# Patient Record
Sex: Female | Born: 1995
Health system: Southern US, Community
[De-identification: ages and names within clinical notes are randomized; demographics above are authoritative.]

## PROBLEM LIST (undated history)

## (undated) DIAGNOSIS — D649 Anemia, unspecified: Secondary | ICD-10-CM

## (undated) HISTORY — PX: NO PAST SURGERIES: SHX2092

---
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
## 2018-03-24 ENCOUNTER — Ambulatory Visit: Payer: Self-pay | Admitting: Certified Nurse Midwife

## 2018-04-20 NOTE — L&D Delivery Note (Addendum)
OB/[facility]N Faculty Practice Delivery Note  Tami Clark is a 23 y.o. G1P1 s/p SVD at [redacted]w[redacted]d. She was admitted for SOL after SROM.   ROM: 25h 28m with clear fluid GBS Status: PCN given Positive/-- (10/15 0000) Maximum Maternal Temperature:  Temp (24hrs), Avg:97.9 F (36.6 C), Min:97.6 F (36.4 C), Max:98.2 F (36.8 C)    Labor Progress: . Patient arrived at 3 cm dilation w/ROM and was induced with pitocin.   Delivery Date/Time: 02/16/2019 at 2131 Delivery: Called to room and patient was complete and pushing. Head delivered in ROA position. No nuchal cord present. Shoulder and body delivered in usual fashion. Infant with spontaneous cry, placed on mother's abdomen, dried and stimulated. Cord clamped x 2 after 1-minute delay, and cut by grandmother. Cord blood drawn. Placenta delivered spontaneously with gentle cord traction. Fundus firm with massage and Pitocin. Labia, perineum, vagina, and cervix inspected with a right periurethral laceration which was repaired with a 4-0 monocryl.  She was also found to have a first-degree perineal body tear that was repaired with 3-0 Vicryl rapid.  Placenta: Spontaneous, intact, extra lobe, three-vessel cord Complications: None Lacerations: Right periurethral and first-degree perineal body EBL: 583 Analgesia: Epidural in place  Infant: APGAR (1 MIN): 9   APGAR (5 MINS): 9   APGAR (10 MINS):    Weight: Pending  IUD placed postplacentally (see separate note).   Gifford Shave, MD  PGY-1, [facility]  02/16/2019 10:05 PM   The above was performed under my direct supervision and guidance.

## 2018-09-14 LAB — OB RESULTS CONSOLE VARICELLA ZOSTER ANTIBODY, IGG: Varicella: IMMUNE

## 2018-09-14 LAB — OB RESULTS CONSOLE RUBELLA ANTIBODY, IGM: Rubella: IMMUNE

## 2018-09-14 LAB — OB RESULTS CONSOLE HIV ANTIBODY (ROUTINE TESTING): HIV: NONREACTIVE

## 2018-09-14 LAB — OB RESULTS CONSOLE RPR: RPR: NONREACTIVE

## 2019-02-02 DIAGNOSIS — Z23 Encounter for immunization: Secondary | ICD-10-CM | POA: Diagnosis not present

## 2019-02-02 DIAGNOSIS — O321XX0 Maternal care for breech presentation, not applicable or unspecified: Secondary | ICD-10-CM | POA: Diagnosis not present

## 2019-02-02 DIAGNOSIS — Z3403 Encounter for supervision of normal first pregnancy, third trimester: Secondary | ICD-10-CM | POA: Diagnosis not present

## 2019-02-02 DIAGNOSIS — Z3689 Encounter for other specified antenatal screening: Secondary | ICD-10-CM | POA: Diagnosis not present

## 2019-02-02 DIAGNOSIS — O99013 Anemia complicating pregnancy, third trimester: Secondary | ICD-10-CM | POA: Diagnosis not present

## 2019-02-02 LAB — OB RESULTS CONSOLE GC/CHLAMYDIA
Chlamydia: NEGATIVE
Gonorrhea: NEGATIVE

## 2019-02-02 LAB — OB RESULTS CONSOLE GBS: GBS: POSITIVE

## 2019-02-10 DIAGNOSIS — Z3A38 38 weeks gestation of pregnancy: Secondary | ICD-10-CM | POA: Diagnosis not present

## 2019-02-10 DIAGNOSIS — O99820 Streptococcus B carrier state complicating pregnancy: Secondary | ICD-10-CM | POA: Diagnosis not present

## 2019-02-10 DIAGNOSIS — O99013 Anemia complicating pregnancy, third trimester: Secondary | ICD-10-CM | POA: Diagnosis not present

## 2019-02-16 ENCOUNTER — Inpatient Hospital Stay (HOSPITAL_COMMUNITY): Payer: BC Managed Care – PPO | Admitting: Anesthesiology

## 2019-02-16 ENCOUNTER — Encounter (HOSPITAL_COMMUNITY): Payer: Self-pay | Admitting: *Deleted

## 2019-02-16 ENCOUNTER — Other Ambulatory Visit: Payer: Self-pay

## 2019-02-16 ENCOUNTER — Inpatient Hospital Stay (HOSPITAL_COMMUNITY)
Admission: AD | Admit: 2019-02-16 | Discharge: 2019-02-18 | DRG: 807 | Disposition: A | Discharge status: Home / Self Care | Payer: BC Managed Care – PPO | Attending: Obstetrics & Gynecology | Admitting: Obstetrics & Gynecology

## 2019-02-16 DIAGNOSIS — Z3A38 38 weeks gestation of pregnancy: Secondary | ICD-10-CM

## 2019-02-16 DIAGNOSIS — D649 Anemia, unspecified: Secondary | ICD-10-CM | POA: Diagnosis present

## 2019-02-16 DIAGNOSIS — Z30430 Encounter for insertion of intrauterine contraceptive device: Secondary | ICD-10-CM

## 2019-02-16 DIAGNOSIS — O0932 Supervision of pregnancy with insufficient antenatal care, second trimester: Secondary | ICD-10-CM

## 2019-02-16 DIAGNOSIS — O99824 Streptococcus B carrier state complicating childbirth: Secondary | ICD-10-CM | POA: Diagnosis present

## 2019-02-16 DIAGNOSIS — O99019 Anemia complicating pregnancy, unspecified trimester: Secondary | ICD-10-CM | POA: Diagnosis present

## 2019-02-16 DIAGNOSIS — O4290 Premature rupture of membranes, unspecified as to length of time between rupture and onset of labor, unspecified weeks of gestation: Secondary | ICD-10-CM | POA: Diagnosis present

## 2019-02-16 DIAGNOSIS — Z20828 Contact with and (suspected) exposure to other viral communicable diseases: Secondary | ICD-10-CM | POA: Diagnosis not present

## 2019-02-16 DIAGNOSIS — O26893 Other specified pregnancy related conditions, third trimester: Secondary | ICD-10-CM | POA: Diagnosis not present

## 2019-02-16 DIAGNOSIS — Z975 Presence of (intrauterine) contraceptive device: Secondary | ICD-10-CM

## 2019-02-16 DIAGNOSIS — B951 Streptococcus, group B, as the cause of diseases classified elsewhere: Secondary | ICD-10-CM

## 2019-02-16 DIAGNOSIS — O9902 Anemia complicating childbirth: Principal | ICD-10-CM | POA: Diagnosis present

## 2019-02-16 DIAGNOSIS — Z3480 Encounter for supervision of other normal pregnancy, unspecified trimester: Secondary | ICD-10-CM

## 2019-02-16 DIAGNOSIS — Z3A00 Weeks of gestation of pregnancy not specified: Secondary | ICD-10-CM | POA: Diagnosis not present

## 2019-02-16 HISTORY — DX: Streptococcus, group b, as the cause of diseases classified elsewhere: B95.1

## 2019-02-16 HISTORY — DX: Anemia, unspecified: D64.9

## 2019-02-16 LAB — CBC
HCT: 33.9 % — ABNORMAL LOW (ref 36.0–46.0)
Hemoglobin: 11 g/dL — ABNORMAL LOW (ref 12.0–15.0)
MCH: 30.2 pg (ref 26.0–34.0)
MCHC: 32.4 g/dL (ref 30.0–36.0)
MCV: 93.1 fL (ref 80.0–100.0)
Platelets: 221 10*3/uL (ref 150–400)
RBC: 3.64 MIL/uL — ABNORMAL LOW (ref 3.87–5.11)
RDW: 12.7 % (ref 11.5–15.5)
WBC: 13.4 10*3/uL — ABNORMAL HIGH (ref 4.0–10.5)
nRBC: 0 % (ref 0.0–0.2)

## 2019-02-16 LAB — POCT PREGNANCY, URINE: Preg Test, Ur: POSITIVE — AB

## 2019-02-16 LAB — TYPE AND SCREEN
ABO/RH(D): O POS
Antibody Screen: NEGATIVE

## 2019-02-16 LAB — SARS CORONAVIRUS 2 BY RT PCR (HOSPITAL ORDER, PERFORMED IN ~~LOC~~ HOSPITAL LAB): SARS Coronavirus 2: NEGATIVE

## 2019-02-16 LAB — POCT FERN TEST: POCT Fern Test: POSITIVE

## 2019-02-16 LAB — RPR: RPR Ser Ql: NONREACTIVE

## 2019-02-16 LAB — ABO/RH: ABO/RH(D): O POS

## 2019-02-16 MED ORDER — LACTATED RINGERS IV SOLN: 500.0000 mL | INTRAVENOUS | Status: DC | PRN

## 2019-02-16 MED ORDER — OXYTOCIN 40 UNITS IN NORMAL SALINE INFUSION - SIMPLE MED
1.0000 m[IU]/min | INTRAVENOUS | Status: DC
  Administered 2019-02-16: 2 m[IU]/min via INTRAVENOUS
  Filled 2019-02-16: qty 1000

## 2019-02-16 MED ORDER — OXYCODONE-ACETAMINOPHEN 5-325 MG PO TABS: 2.0000 | ORAL_TABLET | ORAL | Status: DC | PRN

## 2019-02-16 MED ORDER — LACTATED RINGERS IV SOLN
INTRAVENOUS | Status: DC
  Administered 2019-02-16 (×2): via INTRAVENOUS

## 2019-02-16 MED ORDER — PHENYLEPHRINE 40 MCG/ML (10ML) SYRINGE FOR IV PUSH (FOR BLOOD PRESSURE SUPPORT): 80.0000 ug | PREFILLED_SYRINGE | INTRAVENOUS | Status: DC | PRN

## 2019-02-16 MED ORDER — OXYCODONE-ACETAMINOPHEN 5-325 MG PO TABS: 1.0000 | ORAL_TABLET | ORAL | Status: DC | PRN

## 2019-02-16 MED ORDER — PHENYLEPHRINE 40 MCG/ML (10ML) SYRINGE FOR IV PUSH (FOR BLOOD PRESSURE SUPPORT)
80.0000 ug | PREFILLED_SYRINGE | INTRAVENOUS | Status: DC | PRN
  Filled 2019-02-16: qty 10

## 2019-02-16 MED ORDER — SOD CITRATE-CITRIC ACID 500-334 MG/5ML PO SOLN: 30.0000 mL | ORAL | Status: DC | PRN

## 2019-02-16 MED ORDER — EPHEDRINE 5 MG/ML INJ: 10.0000 mg | INTRAVENOUS | Status: DC | PRN

## 2019-02-16 MED ORDER — LIDOCAINE HCL (PF) 1 % IJ SOLN: 30.0000 mL | INTRAMUSCULAR | Status: DC | PRN

## 2019-02-16 MED ORDER — SODIUM CHLORIDE (PF) 0.9 % IJ SOLN
INTRAMUSCULAR | Status: DC | PRN
  Administered 2019-02-16: 12 mL/h via EPIDURAL

## 2019-02-16 MED ORDER — SODIUM CHLORIDE 0.9 % IV SOLN
5.0000 10*6.[IU] | Freq: Once | INTRAVENOUS | Status: AC
  Administered 2019-02-16: 5 10*6.[IU] via INTRAVENOUS
  Filled 2019-02-16: qty 5

## 2019-02-16 MED ORDER — FENTANYL CITRATE (PF) 100 MCG/2ML IJ SOLN: 100.0000 ug | INTRAMUSCULAR | Status: DC | PRN

## 2019-02-16 MED ORDER — TERBUTALINE SULFATE 1 MG/ML IJ SOLN: 0.2500 mg | Freq: Once | INTRAMUSCULAR | Status: DC | PRN

## 2019-02-16 MED ORDER — FENTANYL-BUPIVACAINE-NACL 0.5-0.125-0.9 MG/250ML-% EP SOLN: 12.0000 mL/h | EPIDURAL | Status: DC | PRN

## 2019-02-16 MED ORDER — LACTATED RINGERS IV SOLN
500.0000 mL | Freq: Once | INTRAVENOUS | Status: AC
  Administered 2019-02-16: 500 mL via INTRAVENOUS

## 2019-02-16 MED ORDER — ACETAMINOPHEN 325 MG PO TABS: 650.0000 mg | ORAL_TABLET | ORAL | Status: DC | PRN

## 2019-02-16 MED ORDER — ONDANSETRON HCL 4 MG/2ML IJ SOLN
4.0000 mg | Freq: Four times a day (QID) | INTRAMUSCULAR | Status: DC | PRN
  Administered 2019-02-16: 4 mg via INTRAVENOUS
  Filled 2019-02-16: qty 2

## 2019-02-16 MED ORDER — LEVONORGESTREL 19.5 MCG/DAY IU IUD
INTRAUTERINE_SYSTEM | Freq: Once | INTRAUTERINE | Status: AC
  Administered 2019-02-16: 1 via INTRAUTERINE
  Filled 2019-02-16: qty 1

## 2019-02-16 MED ORDER — PENICILLIN G 3 MILLION UNITS IVPB - SIMPLE MED
3.0000 10*6.[IU] | INTRAVENOUS | Status: DC
  Administered 2019-02-16 (×3): 3 10*6.[IU] via INTRAVENOUS
  Filled 2019-02-16 (×5): qty 100

## 2019-02-16 MED ORDER — OXYTOCIN 40 UNITS IN NORMAL SALINE INFUSION - SIMPLE MED
2.5000 [IU]/h | INTRAVENOUS | Status: DC
  Administered 2019-02-16: 2.5 [IU]/h via INTRAVENOUS

## 2019-02-16 MED ORDER — DIPHENHYDRAMINE HCL 50 MG/ML IJ SOLN: 12.5000 mg | INTRAMUSCULAR | Status: DC | PRN

## 2019-02-16 MED ORDER — FENTANYL-BUPIVACAINE-NACL 0.5-0.125-0.9 MG/250ML-% EP SOLN
12.0000 mL/h | EPIDURAL | Status: DC | PRN
  Filled 2019-02-16: qty 250

## 2019-02-16 MED ORDER — OXYTOCIN BOLUS FROM INFUSION
500.0000 mL | Freq: Once | INTRAVENOUS | Status: AC
  Administered 2019-02-16: 500 mL via INTRAVENOUS

## 2019-02-16 MED ORDER — LIDOCAINE HCL (PF) 1 % IJ SOLN
INTRAMUSCULAR | Status: DC | PRN
  Administered 2019-02-16 (×2): 5 mL via EPIDURAL

## 2019-02-16 NOTE — Progress Notes (Signed)
LABOR PROGRESS NOTE  Tami Clark is a 23 y.o. female, G1P0 at 38.6 weeks, presenting for SROM that occurred at 2030. Pregnancy complicated by GBS + status and anemia of pregnancy   Subjective: Feeling increased contractions and endorses LOF and no vaginal bleeding.   Objective: BP 124/86 (BP Location: Right Arm)   Pulse 68   Temp 98 F (36.7 C) (Axillary)   Resp 16   Ht 5\' 2"  (1.575 m)   Wt 78 kg   SpO2 100%   BMI 31.46 kg/m  or  Vitals:   02/16/19 1801 02/16/19 1831 02/16/19 1901 02/16/19 1905  BP: 108/61 105/69 124/86   Pulse: 60 72 68   Resp:      Temp:    98 F (36.7 C)  TempSrc:    Axillary  SpO2:      Weight:      Height:        Dilation: 8 Effacement (%): 80, 90 Cervical Position: Middle Station: -1 Presentation: Vertex Exam by:: Loraine Grip, RN; Jones Bales, MD FHT: baseline rate 130, moderate varibility, +acel, -decel Toco: q 3 min  Labs: Lab Results  Component Value Date   WBC 13.4 (H) 02/16/2019   HGB 11.0 (L) 02/16/2019   HCT 33.9 (L) 02/16/2019   MCV 93.1 02/16/2019   PLT 221 02/16/2019    Patient Active Problem List   Diagnosis Date Noted  . Amniotic fluid leaking 02/16/2019  . Group beta Strep positive 02/16/2019  . Anemia in pregnancy, unspecified trimester 02/16/2019  . Late prenatal care in second trimester 02/16/2019    Assessment / Plan:  Tami Clark is a 23 y.o. female, G1P0 at 38.6 weeks, presenting for SROM that occurred at 2030. Pregnancy complicated by GBS + status and anemia of pregnancy   Labor: Progressing in active labor at 8 cm @ 1720. Continuing Augmentation with pitocin  ID : Penicillin ppx  Fetal Wellbeing:  Cat 1  Pain Control:  Epidural  Anticipated MOD:  VD  Maxie Better, PGY-1 Labor and Delivery Teaching service  02/16/2019, 7:18 PM

## 2019-02-16 NOTE — Anesthesia Preprocedure Evaluation (Signed)

## 2019-02-16 NOTE — Anesthesia Procedure Notes (Signed)
Epidural Patient location during procedure: OB  Staffing Anesthesiologist: Aurelio Mccamy, MD Performed: anesthesiologist   Preanesthetic Checklist Completed: patient identified, site marked, surgical consent, pre-op evaluation, timeout performed, IV checked, risks and benefits discussed and monitors and equipment checked  Epidural Patient position: sitting Prep: DuraPrep Patient monitoring: heart rate, continuous pulse ox and blood pressure Approach: right paramedian Location: L3-L4 Injection technique: LOR saline  Needle:  Needle type: Tuohy  Needle gauge: 17 G Needle length: 9 cm and 9 Needle insertion depth: 6 cm Catheter type: closed end flexible Catheter size: 20 Guage Catheter at skin depth: 10 cm Test dose: negative  Assessment Events: blood not aspirated, injection not painful, no injection resistance, negative IV test and no paresthesia  Additional Notes Patient identified. Risks/Benefits/Options discussed with patient including but not limited to bleeding, infection, nerve damage, paralysis, failed block, incomplete pain control, headache, blood pressure changes, nausea, vomiting, reactions to medication both or allergic, itching and postpartum back pain. Confirmed with bedside nurse the patient's most recent platelet count. Confirmed with patient that they are not currently taking any anticoagulation, have any bleeding history or any family history of bleeding disorders. Patient expressed understanding and wished to proceed. All questions were answered. Sterile technique was used throughout the entire procedure. Please see nursing notes for vital signs. Test dose was given through epidural needle and negative prior to continuing to dose epidural or start infusion. Warning signs of high block given to the patient including shortness of breath, tingling/numbness in hands, complete motor block, or any concerning symptoms with instructions to call for help. Patient was given  instructions on fall risk and not to get out of bed. All questions and concerns addressed with instructions to call with any issues.     

## 2019-02-16 NOTE — Procedures (Signed)
Post-Placental IUD Insertion Procedure Note  Patient identified, informed consent signed prior to delivery, signed copy in chart, time out was performed.    Vaginal, labial and perineal areas thoroughly inspected for lacerations. 1st degree laceration identified - not hemostatic,repaired prior to insertion of IUD.  Tami Clark  - - IUD inserted with inserter per manufacturer's instructions.    Strings trimmed to the level of the introitus. Patient tolerated procedure well.   Patient given post procedure instructions and IUD care card with expiration date.  Patient is asked to keep IUD strings tucked in her vagina until her postpartum follow up visit in 4-6 weeks. Patient advised to abstain from sexual intercourse and pulling on strings before her follow-up visit. Patient verbalized an understanding of the plan of care and agrees.

## 2019-02-16 NOTE — MAU Note (Signed)
Leaked at 2030 last night and some mucous d/c throughout the night. Now having some pink to bright spotting. Mild cramping. PNC at [facility] in [place]

## 2019-02-16 NOTE — MAU Note (Signed)
GBS positive per pt done on 02/02/19. GC.CT neg.    Gilmer Mor RN

## 2019-02-16 NOTE — H&P (Signed)
Tami Clark is a 23 y.o. female, G1P0 at 38.6 weeks, presenting for SROM that occurred at 2030.  Patient receives care at [facility] in [place], [place] and was supervised for a low  risk pregnancy. Pregnancy and medical history significant for problems as listed below. She is GBS positive and expresses a desire for epidural for pain management.  She is anticipating a female infant and requests IUD for PP birth control method.     Patient Active Problem List   Diagnosis Date Noted  . Amniotic fluid leaking 02/16/2019  . Group beta Strep positive 02/16/2019  . Anemia in pregnancy, unspecified trimester 02/16/2019  . Late prenatal care in second trimester 02/16/2019    History of present pregnancy: Patient entered care at 16.5 weeks.   EDC of 02/24/2019 was established by 13 week [place] on Aug 24, 2018.   Anatomy scan:  21 weeks, with normal findings and an posterior gr 0 placenta.   Additional [place] evaluations:  F/U Anatomy at 24 weeks-Normal Significant prenatal events: 1st Trimester: No Care 2nd Trimester: Entry to Care with [place], Patient with common pregnancy discomforts to include fatigue, leg cramping, and aching feet. 3rd Trimester:   Anemia with HgB at 10-Started Iron Last evaluation:  02/10/2019 in office by her Primary OB Provider  OB History    Gravida  1   Para      Term      Preterm      AB      Living        SAB      TAB      Ectopic      Multiple      Live Births             Past Medical History:  Diagnosis Date  . Anemia    Past Surgical History:  Procedure Laterality Date  . NO PAST SURGERIES     Family History: family history includes Arthritis in her father, maternal uncle, paternal aunt, and paternal grandmother; Asthma in her maternal grandfather and paternal aunt; Diabetes in her maternal grandfather; Hypertension in her maternal aunt, maternal grandmother, paternal aunt, and paternal grandmother; Stroke in her maternal  grandfather. Social History:  reports that she has never smoked. She has never used smokeless tobacco. She reports that she does not drink alcohol or use drugs.   Prenatal Transfer Tool  Maternal Diabetes: No Genetic Screening: Normal Maternal Ultrasounds/Referrals: Normal Fetal Ultrasounds or other Referrals:  None Maternal Substance Abuse:  No Significant Maternal Medications:  Meds include: Other: Iron Significant Maternal Lab Results: Group B Strep positive   ROS:  +FM, +LOF, +Ctx,  No other complaints  No Known Allergies   Dilation: 3 Effacement (%): 50 Station: -2 Exam by:: Loraine Grip, RN  Blood pressure (!) 130/92, pulse 73, temperature 97.7 F (36.5 C), temperature source Oral, resp. rate 16, height 5\' 2"  (1.575 m), weight 78 kg.  Physical Exam  Constitutional: She is oriented to person, place, and time. She appears well-developed and well-nourished.  HENT:  Head: Normocephalic and atraumatic.  Eyes: Conjunctivae are normal.  Neck: Normal range of motion.  Cardiovascular: Normal rate and normal heart sounds.  Respiratory: Effort normal and breath sounds normal.  GI: Soft. Bowel sounds are normal.  Musculoskeletal: Normal range of motion.  Neurological: She is alert and oriented to person, place, and time.  Skin: Skin is warm and dry.  Multiple Tattoos  Psychiatric: She has a normal  mood and affect. Her behavior is normal.    Leopolds: EFW: 7lbs Presentation: Vertex  FHR: 125 bpm, Mod Var, -Decels, +Accels UCs:  Q3-64min, palpates mild  Prenatal labs: ABO, Rh: --/--/O POS, O POS Performed at [facility], [address], [place], [place] [address]  [phone]/29 0720) Antibody: NEG (10/29 0720) Rubella:  Immune RPR: NON REACTIVE (10/29 0719)  HBsAg:  Negative 09/14/2018 HIV:   NR 02/02/2019 GBS: Positive/-- (10/15 0000) Sickle cell/Hgb electrophoresis:  Negative Pap:  NILM 09/14/2018 GC:  Negative Chlamydia:  Negative Other:  Negative  Drug  Assessment IUP at 38.6 weeks Cat I FT SROM GBS Positive Female Infant-Circ Unknown IUD Desired  Plan: Admitted to [facility]  Routine Labor and Delivery Orders per Protocol In room to complete assessment and discuss POC: Discussed augmentation considering rupture time and infrequent contractions. Reviewed cytotec and pitocin usage. Will have nurse check cervix and start augmentation method as appropriate. Patient unsure of whether she wants a PP IUD. Patient without questions or concerns.  Standard induction orders placed.  Joellyn Quails, MSN 02/16/2019, 12:45 PM

## 2019-02-16 NOTE — Discharge Summary (Signed)
Postpartum Discharge Summary     Patient Name: Tami Clark DOB: 03/03/1996 MRN: [id number]  Date of admission: 02/16/2019 Delivering Provider: Gifford Shave   Date of discharge: 02/18/2019  Admitting diagnosis: 29 WKS,SPOTTING, DISCHARGE Intrauterine pregnancy: [redacted]w[redacted]d    Secondary diagnosis:  Active Problems:   Amniotic fluid leaking   Group beta Strep positive   Anemia in pregnancy, unspecified trimester   Late prenatal care in second trimester   IUD (intrauterine device) in place  Additional problems: n/a     Discharge diagnosis: Term Pregnancy Delivered                                                                                                Post partum procedures:postplacental liletta IUD placed  Augmentation: Pitocin  Complications: None  Hospital course:  Onset of Labor With Vaginal Delivery     23y.o. yo G1P0 at 333w6das admitted in Latent Labor on 02/16/2019. Patient had an uncomplicated labor course as follows: She had SROM and came in  Membrane Rupture Time/Date: 8:30 PM ,02/15/2019   Intrapartum Procedures: Episiotomy: None [1]                                         Lacerations:  1st degree [2];Periurethral [8]  Patient had a delivery of a Viable infant. 02/16/2019  Information for the patient's newborn:  MaJuanell, Saffo0[[id number]]Delivery Method: Vag-Spont     Pateint had an uncomplicated postpartum course.  She is ambulating, tolerating a regular diet, passing flatus, and urinating well. Patient is discharged home in stable condition on 02/18/19.  Delivery time: 9:31 PM    Magnesium Sulfate received: No BMZ received: No Rhophylac:N/A MMR:No Transfusion:No  Physical exam  Vitals:   02/17/19 0945 02/17/19 1345 02/17/19 2222 02/18/19 0535  BP: 107/76 119/85 106/78 106/76  Pulse: 91 71 75 80  Resp: 16 20 16 18   Temp: 98.4 F (36.9 C) 98.1 F (36.7 C) 98.2 F (36.8 C) 98.1 F (36.7 C)  TempSrc: Oral Oral Oral Oral  SpO2:   99%  99%  Weight:      Height:       General: alert, cooperative and no distress Lochia: appropriate Uterine Fundus: firm Incision: N/A DVT Evaluation: No evidence of DVT seen on physical exam. Labs: Lab Results  Component Value Date   WBC 16.8 (H) 02/17/2019   HGB 10.3 (L) 02/17/2019   HCT 30.6 (L) 02/17/2019   MCV 91.1 02/17/2019   PLT 200 02/17/2019   CMP Latest Ref Rng & Units 01/22/2014  Glucose 70 - 99 mg/dL 86  BUN 6 - 23 mg/dL 9  Creatinine 0.10 - 1.20 mg/dL 0.78  Sodium 135 - 145 mEq/L 141  Potassium 3.5 - 5.3 mEq/L 4.1  Chloride 96 - 112 mEq/L 104  CO2 19 - 32 mEq/L 27  Calcium 8.4 - 10.5 mg/dL 10.1    Discharge instruction: per After Visit Summary and "Baby and Me Booklet".  After visit meds:  Allergies as of 02/18/2019   No Known Allergies     Medication List    TAKE these medications   calcium carbonate 500 MG chewable tablet Commonly known as: TUMS - dosed in mg elemental calcium Chew 1 tablet by mouth as needed for indigestion or heartburn.   ferrous sulfate 325 (65 FE) MG EC tablet Take 325 mg by mouth once.   ibuprofen 600 MG tablet Commonly known as: ADVIL Take 1 tablet (600 mg total) by mouth every 6 (six) hours.   prenatal multivitamin Tabs tablet Take 1 tablet by mouth daily at 12 noon.   terbinafine 1 % cream Commonly known as: LamISIL AT Apply 1 application topically 2 (two) times daily.       Diet: routine diet  Activity: Advance as tolerated. Pelvic rest for 6 weeks.   Outpatient follow up:4 weeks Follow up Appt:No future appointments. Follow up Visit: Please schedule this patient for Postpartum visit in: 4 weeks with the following provider: Any provider For C/S patients schedule nurse incision check in weeks 2 weeks: no Low risk pregnancy complicated by: n/a Delivery mode:  SVD Anticipated Birth Control:  IUD placed postplacental PP Procedures needed: n/a  Schedule Integrated [place] visit: no   Patient got prenatal care  in [place] but wishes to follow up at [place]: Live born female  Birth Weight:   APGAR: 78, 9  Newborn Delivery   Birth date/time: 02/16/2019 21:31:00 Delivery type: Vaginal, Spontaneous      Baby Feeding: Breast Disposition:home with mother   02/18/2019 Wende Mott, CNM

## 2019-02-17 ENCOUNTER — Encounter (HOSPITAL_COMMUNITY): Payer: Self-pay

## 2019-02-17 DIAGNOSIS — Z975 Presence of (intrauterine) contraceptive device: Secondary | ICD-10-CM

## 2019-02-17 LAB — CBC
HCT: 30.6 % — ABNORMAL LOW (ref 36.0–46.0)
Hemoglobin: 10.3 g/dL — ABNORMAL LOW (ref 12.0–15.0)
MCH: 30.7 pg (ref 26.0–34.0)
MCHC: 33.7 g/dL (ref 30.0–36.0)
MCV: 91.1 fL (ref 80.0–100.0)
Platelets: 200 10*3/uL (ref 150–400)
RBC: 3.36 MIL/uL — ABNORMAL LOW (ref 3.87–5.11)
RDW: 12.6 % (ref 11.5–15.5)
WBC: 16.8 10*3/uL — ABNORMAL HIGH (ref 4.0–10.5)
nRBC: 0 % (ref 0.0–0.2)

## 2019-02-17 MED ORDER — DIBUCAINE (PERIANAL) 1 % EX OINT: 1.0000 "application " | TOPICAL_OINTMENT | CUTANEOUS | Status: DC | PRN

## 2019-02-17 MED ORDER — DIPHENHYDRAMINE HCL 25 MG PO CAPS: 25.0000 mg | ORAL_CAPSULE | Freq: Four times a day (QID) | ORAL | Status: DC | PRN

## 2019-02-17 MED ORDER — COCONUT OIL OIL: 1.0000 "application " | TOPICAL_OIL | Status: DC | PRN

## 2019-02-17 MED ORDER — ONDANSETRON HCL 4 MG PO TABS: 4.0000 mg | ORAL_TABLET | ORAL | Status: DC | PRN

## 2019-02-17 MED ORDER — ONDANSETRON HCL 4 MG/2ML IJ SOLN: 4.0000 mg | INTRAMUSCULAR | Status: DC | PRN

## 2019-02-17 MED ORDER — ACETAMINOPHEN 325 MG PO TABS
650.0000 mg | ORAL_TABLET | ORAL | Status: DC | PRN
  Filled 2019-02-17: qty 2

## 2019-02-17 MED ORDER — SIMETHICONE 80 MG PO CHEW: 80.0000 mg | CHEWABLE_TABLET | ORAL | Status: DC | PRN

## 2019-02-17 MED ORDER — ZOLPIDEM TARTRATE 5 MG PO TABS: 5.0000 mg | ORAL_TABLET | Freq: Every evening | ORAL | Status: DC | PRN

## 2019-02-17 MED ORDER — WITCH HAZEL-GLYCERIN EX PADS: 1.0000 "application " | MEDICATED_PAD | CUTANEOUS | Status: DC | PRN

## 2019-02-17 MED ORDER — PRENATAL MULTIVITAMIN CH
1.0000 | ORAL_TABLET | Freq: Every day | ORAL | Status: DC
  Administered 2019-02-17 – 2019-02-18 (×2): 1 via ORAL
  Filled 2019-02-17 (×2): qty 1

## 2019-02-17 MED ORDER — BENZOCAINE-MENTHOL 20-0.5 % EX AERO
1.0000 "application " | INHALATION_SPRAY | CUTANEOUS | Status: DC | PRN
  Administered 2019-02-17: 1 via TOPICAL
  Filled 2019-02-17: qty 56

## 2019-02-17 MED ORDER — TETANUS-DIPHTH-ACELL PERTUSSIS 5-2.5-18.5 LF-MCG/0.5 IM SUSP: 0.5000 mL | Freq: Once | INTRAMUSCULAR | Status: DC

## 2019-02-17 MED ORDER — SENNOSIDES-DOCUSATE SODIUM 8.6-50 MG PO TABS
2.0000 | ORAL_TABLET | ORAL | Status: DC
  Administered 2019-02-17: 2 via ORAL
  Filled 2019-02-17 (×2): qty 2

## 2019-02-17 MED ORDER — IBUPROFEN 600 MG PO TABS
600.0000 mg | ORAL_TABLET | Freq: Four times a day (QID) | ORAL | Status: DC
  Administered 2019-02-17 – 2019-02-18 (×6): 600 mg via ORAL
  Filled 2019-02-17 (×6): qty 1

## 2019-02-17 NOTE — Progress Notes (Signed)
Post Partum Day 1 Subjective: no complaints, up ad lib, voiding and tolerating PO, small lochia, plans to breastfeed, IUD placed PP  Objective: Blood pressure 114/79, pulse 79, temperature 98 F (36.7 C), temperature source Oral, resp. rate 17, height 5\' 2"  (1.575 m), weight 78 kg, SpO2 100 %, unknown if currently breastfeeding.  Physical Exam:  General: alert, cooperative and no distress Lochia:normal flow Chest: CTAB Heart: RRR no m/r/g Abdomen: +BS, soft, nontender,  Uterine Fundus: firm DVT Evaluation: No evidence of DVT seen on physical exam. Extremities: trace edema  Recent Labs    02/16/19 0719 02/17/19 0548  HGB 11.0* 10.3*  HCT 33.9* 30.6*    Assessment/Plan: Plan for discharge tomorrow   LOS: 1 day   Christin Fudge 02/17/2019, 7:35 AM

## 2019-02-17 NOTE — Anesthesia Postprocedure Evaluation (Signed)
Anesthesia Post Note  Patient: Criss Rosales  Procedure(s) Performed: AN AD [place]     Patient location during evaluation: Mother Baby Anesthesia Type: Epidural Level of consciousness: awake and alert Pain management: pain level controlled Vital Signs Assessment: post-procedure vital signs reviewed and stable Respiratory status: spontaneous breathing, nonlabored ventilation and respiratory function stable Cardiovascular status: stable Postop Assessment: no headache, no backache and epidural receding Anesthetic complications: no    Last Vitals:  Vitals:   02/17/19 0110 02/17/19 0529  BP: 109/79 114/79  Pulse: 81 79  Resp: 18 17  Temp: 36.7 C 36.7 C  SpO2: 100% 100%    Last Pain:  Vitals:   02/17/19 0710  TempSrc:   PainSc: 0-No pain   Pain Goal: Patients Stated Pain Goal: 0 (02/16/19 0557)                 Barkley Boards

## 2019-02-17 NOTE — Progress Notes (Signed)
Post Partum Day 1 Subjective: no complaints, up ad lib, voiding and tolerating PO, small lochia, plans to breastfeed, PP IUD placed  Objective: Blood pressure 114/79, pulse 79, temperature 98 F (36.7 C), temperature source Oral, resp. rate 17, height 5\' 2"  (1.575 m), weight 78 kg, SpO2 100 %, unknown if currently breastfeeding.  Physical Exam:  General: alert, cooperative and no distress Lochia:normal flow Chest: CTAB Heart: RRR no m/r/g Abdomen: +BS, soft, nontender,  Uterine Fundus: firm DVT Evaluation: No evidence of DVT seen on physical exam. Extremities: trace edema  Recent Labs    02/16/19 0719 02/17/19 0548  HGB 11.0* 10.3*  HCT 33.9* 30.6*    Assessment/Plan: Plan for discharge tomorrow   LOS: 1 day   Christin Fudge 02/17/2019, 7:31 AM

## 2019-02-17 NOTE — Progress Notes (Addendum)
POSTPARTUM PROGRESS NOTE  Post Partum Day 1  Subjective:  Tami Clark is a 23 y.o. G1P1001 s/p SVD at [redacted]w[redacted]d  She reports she is doing well. No acute events overnight. She denies any problems with ambulating, voiding or po intake. Denies nausea or vomiting.  Pain is well controlled.  Lochia is heavy but appropriate given late delivery.  Objective: Blood pressure 114/79, pulse 79, temperature 98.1 F (36.7 C), temperature source Oral, resp. rate 18, height 5' 2"  (1.575 m), weight 78 kg, SpO2 100 %, unknown if currently breastfeeding.  Physical Exam:  General: alert, cooperative and no distress Chest: no respiratory distress Heart:regular rate, distal pulses intact Abdomen: soft, nontender,  Uterine Fundus: firm, appropriately tender DVT Evaluation: No calf swelling or tenderness Extremities: No peripheral edema Skin: warm, dry  Recent Labs    02/16/19 0719  HGB 11.0*  HCT 33.9*    Assessment/Plan: TCOTI BURDis a 23y.o. G1P1001 s/p SVD at 356w6d PPD#*1 - Doing well  Routine postpartum care  Contraception: IUD  Feeding: Breast  Dispo: Plan for discharge tomorrow.   LOS: 1 day   ViGifford ShaveMD  PGY-1, [facility]  02/17/2019, 6:14 AM   I personally saw and evaluated the patient, performing the key elements of the service. I developed and verified the management plan that is described in the resident's/student's note, and I agree with the content with my edits above. VSS, HRR&R, Resp unlabored, Legs neg.  FrNigel BertholdCNM 02/17/2019 7:48 AM

## 2019-02-17 NOTE — Lactation Note (Signed)
This note was copied from a baby's chart. Lactation Consultation Note  Patient Name: Tami Clark [id number] Date: 02/17/2019 Reason for consult: Follow-up assessment Early term infant. Mom and baby sts on arrival.  Mom reports they are breastfeeding well.  Mom denies for lactation assistance at this time.Urged mom to call lactation as needed.  Reviewed what to expect for babies age.   Maternal Data    Feeding Feeding Type: Breast Fed  LATCH Score                   Interventions Interventions: Breast feeding basics reviewed  Lactation Tools Discussed/Used     Consult Status Consult Status: Follow-up Date: 02/18/19 Follow-up type: In-patient    [facility] Caul 02/17/2019, 7:49 PM

## 2019-02-17 NOTE — Anesthesia Postprocedure Evaluation (Signed)
Anesthesia Post Note  Patient: CHIANTI GOH  Procedure(s) Performed: AN AD [place]     Patient location during evaluation: Mother Baby Anesthesia Type: Epidural Level of consciousness: awake and alert Pain management: pain level controlled Vital Signs Assessment: post-procedure vital signs reviewed and stable Respiratory status: spontaneous breathing, nonlabored ventilation and respiratory function stable Cardiovascular status: stable Postop Assessment: no headache, no backache and epidural receding Anesthetic complications: no    Last Vitals:  Vitals:   02/17/19 0529 02/17/19 0945  BP: 114/79 107/76  Pulse: 79 91  Resp: 17 16  Temp: 36.7 C 36.9 C  SpO2: 100%     Last Pain:  Vitals:   02/17/19 0945  TempSrc: Oral  PainSc: 0-No pain   Pain Goal: Patients Stated Pain Goal: 0 (02/16/19 0557)                 Barkley Boards

## 2019-02-17 NOTE — Lactation Note (Addendum)
This note was copied from a baby's chart. Lactation Consultation Note  Patient Name: Tami Clark [id number] Date: 02/17/2019 Reason for consult: Initial assessment;1st time breastfeeding;Early term 37-38.6wks P1, 6 hour female infant. Grandmother is support person and per grandmother,  she breastfeed mom for 6 months. Infant had one void and LC changed a large stool meconium while in room.  Mom is active  on the [facility] Program in [facility] and she has [employer] DEBP at home. Per mom, this is her third time latching infant to breast. Tools given: harmony hand pump mpm is semi-short shafted mom will pre-pump prior to latching infant at breast.  LC reviewed hand expression with mom and mom taught back. LC gave mom, harmony hand pump to pre-pump breast due mom being semi short shafted. Mom pre-pump breast with hand pump and latched infant on left breast using the cross cradle hold, infant latched with top lip flange out, nose and chin touching breast, swallows observed. Infant was still breastfeeding after 10 minutes when LC left the room. Mom know to breastfeed infant according hunger cues, 8 to 12 times within 24 hours and on demand. Mom knows to call Nurse or [place] if she has any questions, concerns or need assistance with latching infant to breast.  Mom will continue to do as much STS as possible. Mom made aware of O/P services, breastfeeding support groups, community resources, and our phone # for post-discharge questions.  Maternal Data Formula Feeding for Exclusion: No Has patient been taught Hand Expression?: Yes(Mom taught back hand expression) Does the patient have breastfeeding experience prior to this delivery?: No  Feeding Feeding Type: Breast Fed  LATCH Score Latch: Grasps breast easily, tongue down, lips flanged, rhythmical sucking.  Audible Swallowing: Spontaneous and intermittent  Type of Nipple: Everted at rest and after stimulation(semi short shafted mom will pre-pump  prior to latching infant at breast.)  Comfort (Breast/Nipple): Soft / non-tender  Hold (Positioning): Assistance needed to correctly position infant at breast and maintain latch.  LATCH Score: 9  Interventions Interventions: Breast feeding basics reviewed;Breast compression;Adjust position;Assisted with latch;Hand pump;Support pillows;Skin to skin;Breast massage;Position options;Hand express;Expressed milk;Pre-pump if needed  Lactation Tools Discussed/Used Tools: Pump Breast pump type: Manual WIC Program: Yes Pump Review: Setup, frequency, and cleaning;Milk Storage Initiated by:: Vicente Serene, IBCLC Date initiated:: 02/17/19   Consult Status Consult Status: Follow-up Date: 02/17/19 Follow-up type: In-patient    Vicente Serene 02/17/2019, 3:57 AM

## 2019-02-18 MED ORDER — IBUPROFEN 600 MG PO TABS: 600.0000 mg | ORAL_TABLET | Freq: Four times a day (QID) | ORAL | 0 refills | Status: DC

## 2019-02-18 NOTE — Discharge Instructions (Signed)

## 2019-02-18 NOTE — Lactation Note (Signed)
This note was copied from a baby's chart. Lactation Consultation Note  Patient Name: Boy Rahma Meller [id number] Date: 02/18/2019 Reason for consult: 1st time breastfeeding;Follow-up assessment;Early term 37-38.6wks  [id number]  Randel Books is now 55 hours old, delivered @ 38.6wks with 6% wt loss at this time, anticipated discharge today.  LC entered room to find mom in bed holding baby in cradle position and latched to the breast. Mom denies any pain or discomfort with latch, denies difficulty with latching.   Baby latched perfectly and praised mom for efforts. Reviewed infant with flanged lips and wide angle of the mouth. Baby with rhythmic suckles and comfortable at the breast. Mom holding baby skin to skin in cradle hold position. Swallows noted.  Reviewed milk storage guidelines and referred to mother baby handbook. Reviewed engorgement prevention and relief techniques using ice, hand expression, and pre-pumping with manual pump. Reviewed expected output before and after mature milk production. Reviewed OP lactation services and lactation phone number on brochure. Encouraged mom to call as needed and also follow up with her [facility] office.  Feeding Feeding Type: Breast Fed  LATCH Score Latch: Grasps breast easily, tongue down, lips flanged, rhythmical sucking.  Audible Swallowing: Spontaneous and intermittent  Type of Nipple: Everted at rest and after stimulation  Comfort (Breast/Nipple): Soft / non-tender  Hold (Positioning): No assistance needed to correctly position infant at breast.  LATCH Score: 10  Interventions Interventions: Breast feeding basics reviewed;Skin to skin;Hand express;Pre-pump if needed;Expressed milk;Ice;Hand pump  Lactation Tools Discussed/Used WIC Program: Yes Pump Review: Milk Storage   Consult Status Consult Status: Complete Date: 02/18/19    Cranston Neighbor 02/18/2019, 9:19 AM

## 2019-03-30 DIAGNOSIS — Z30431 Encounter for routine checking of intrauterine contraceptive device: Secondary | ICD-10-CM | POA: Diagnosis not present

## 2019-05-09 ENCOUNTER — Encounter (HOSPITAL_COMMUNITY): Payer: Self-pay | Admitting: Family Medicine

## 2019-08-19 DIAGNOSIS — U071 COVID-19: Secondary | ICD-10-CM

## 2019-08-19 HISTORY — DX: COVID-19: U07.1

## 2020-02-20 DIAGNOSIS — Z30432 Encounter for removal of intrauterine contraceptive device: Secondary | ICD-10-CM | POA: Diagnosis not present

## 2020-02-20 DIAGNOSIS — Z113 Encounter for screening for infections with a predominantly sexual mode of transmission: Secondary | ICD-10-CM | POA: Diagnosis not present

## 2020-02-20 DIAGNOSIS — Z114 Encounter for screening for human immunodeficiency virus [HIV]: Secondary | ICD-10-CM | POA: Diagnosis not present

## 2020-02-20 DIAGNOSIS — Z30011 Encounter for initial prescription of contraceptive pills: Secondary | ICD-10-CM | POA: Diagnosis not present

## 2020-04-20 NOTE — L&D Delivery Note (Signed)
Delivery Note Arrived to MAU with fetal head crowning in wheelchair.  States water broke just before arrival.  At 6:11 AM a viable and healthy female was delivered via Vaginal, Spontaneous (Presentation: OA).  APGAR: 8, 9; weight  .   Placenta status: Spontaneous, Intact.  Cord: 3 vessels with the following complications: none    Anesthesia: None Episiotomy: None Lacerations: None Suture Repair:  none Est. Blood Loss (mL): 500  Mom to postpartum.  Baby to Couplet care / Skin to Skin.  Wynelle Bourgeois 12/26/2020, 7:37 AM  Please schedule this patient for Postpartum visit in: 4 weeks with the following provider: Any provider Either virtual or inperson For C/S patients schedule nurse incision check in weeks 2 weeks: no Low risk pregnancy complicated by:  nothing Delivery mode:  SVD Anticipated Birth Control:  IUD PP Procedures needed:  none   Edinburgh: negative Schedule Integrated BH visit: no  No relevant baby issues

## 2020-06-17 ENCOUNTER — Encounter: Payer: Self-pay | Admitting: Obstetrics and Gynecology

## 2020-06-17 ENCOUNTER — Ambulatory Visit (INDEPENDENT_AMBULATORY_CARE_PROVIDER_SITE_OTHER): Payer: BC Managed Care – PPO | Admitting: Obstetrics and Gynecology

## 2020-06-17 ENCOUNTER — Other Ambulatory Visit: Payer: Self-pay

## 2020-06-17 ENCOUNTER — Other Ambulatory Visit (HOSPITAL_COMMUNITY)
Admission: RE | Admit: 2020-06-17 | Discharge: 2020-06-17 | Disposition: A | Discharge status: Home / Self Care | Payer: BC Managed Care – PPO | Source: Ambulatory Visit | Attending: Obstetrics and Gynecology | Admitting: Obstetrics and Gynecology

## 2020-06-17 VITALS — BP 128/83 | HR 77 | Wt 170.0 lb

## 2020-06-17 DIAGNOSIS — Z3481 Encounter for supervision of other normal pregnancy, first trimester: Secondary | ICD-10-CM | POA: Diagnosis not present

## 2020-06-17 DIAGNOSIS — E669 Obesity, unspecified: Secondary | ICD-10-CM

## 2020-06-17 DIAGNOSIS — Z3480 Encounter for supervision of other normal pregnancy, unspecified trimester: Secondary | ICD-10-CM

## 2020-06-17 DIAGNOSIS — Z3A12 12 weeks gestation of pregnancy: Secondary | ICD-10-CM | POA: Diagnosis not present

## 2020-06-17 DIAGNOSIS — O99210 Obesity complicating pregnancy, unspecified trimester: Secondary | ICD-10-CM | POA: Diagnosis not present

## 2020-06-17 HISTORY — DX: Obesity, unspecified: E66.9

## 2020-06-17 NOTE — Progress Notes (Signed)
New OB Note  06/17/2020   Clinic: Center for [facility]  Chief Complaint: new OB  Transfer of Care Patient: no  History of Present Illness: Tami Clark is a 25 y.o. G2P1001 @ 12/0 weeks (EDC 9/12, based on Patient's last menstrual period was 03/25/2020.=u/s today).  Preg complicated by has Supervision of other normal pregnancy, antepartum on their problem list.   Any events prior to today's visit: no Her periods were: qmonth and regular after Liletta removed 01/2020 She was using no method when she conceived.  She has Negative signs or symptoms of nausea/vomiting of pregnancy. She has Negative signs or symptoms of miscarriage or preterm labor On any medications around the time she conceived/early pregnancy: No   ROS: A 12-point review of systems was performed and negative, except as stated in the above HPI.  OBGYN History: As per HPI. OB History  Gravida Para Term Preterm AB Living  2 1 1     1   SAB IAB Ectopic Multiple Live Births        0 1    # Outcome Date GA Lbr Len/2nd Weight Sex Delivery Anes PTL Lv  2 Current           1 Term 02/16/19 [redacted]w[redacted]d 21:10 / 00:21 8 lb 3.6 oz (3.731 kg) M Vag-Spont EPI  LIV     Birth Comments: wnl    Any issues with any prior pregnancies: no Prior children are healthy, doing well, and without any problems or issues: yes History of pap smears: Yes. Last pap smear 2020 and results were negative   Past Medical History: Past Medical History:  Diagnosis Date  . Anemia   . COVID-19 08/2019  . Group beta Strep positive 02/16/2019    Past Surgical History: Past Surgical History:  Procedure Laterality Date  . NO PAST SURGERIES      Family History:  Family History  Problem Relation Age of Onset  . Arthritis Father   . Hypertension Maternal Aunt   . Arthritis Maternal Uncle   . Arthritis Paternal Aunt   . Asthma Paternal Aunt   . Hypertension Paternal Aunt   . Hypertension Maternal Grandmother   . Asthma Maternal  Grandfather   . Diabetes Maternal Grandfather   . Stroke Maternal Grandfather   . Arthritis Paternal Grandmother   . Hypertension Paternal Grandmother    She denies any history of mental retardation, birth defects or genetic disorders in her or the FOB's history  Social History:  Social History   Socioeconomic History  . Marital status: Single    Spouse name: Not on file  . Number of children: Not on file  . Years of education: Not on file  . Highest education level: Not on file  Occupational History  . Not on file  Tobacco Use  . Smoking status: Never Smoker  . Smokeless tobacco: Never Used  Vaping Use  . Vaping Use: Never used  Substance and Sexual Activity  . Alcohol use: No  . Drug use: No  . Sexual activity: Yes    Birth control/protection: None  Other Topics Concern  . 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   Considering physical therapy or athletic training.   Exercise - goes to gym, treadmill, sometimes weights.  Did track and basketball, will do the same this year.   Has a boyfriend x 7 months.     Social Determinants  of Health   Financial Resource Strain: Not on file  Food Insecurity: Not on file  Transportation Needs: Not on file  Physical Activity: Not on file  Stress: Not on file  Social Connections: Not on file  Intimate Partner Violence: Not on file    Allergy: No Known Allergies  Health Maintenance:  Mammogram Up to Date: not applicable  Current Outpatient Medications: PN vitamin  Physical Exam:   BP 128/83   Pulse 77   Wt 170 lb (77.1 kg)   LMP 03/25/2020   BMI 31.09 kg/m  Body mass index is 31.09 kg/m. Contractions: Not present Vag. Bleeding: None. Fundal height: not applicable FHTs: 160s-170s  General appearance: Well nourished, well developed female in no acute distress.  Cardiovascular: S1, S2 normal, no murmur, rub or gallop, regular rate and  rhythm Respiratory:  Clear to auscultation bilateral. Normal respiratory effort Abdomen: positive bowel sounds and no masses, hernias; diffusely non tender to palpation, non distended Neuro/Psych:  Normal mood and affect.  Skin:  Warm and dry.   Laboratory: none  Imaging:  Bedside u/s c/w SLIUP at 12/0 based on CRL, normal FHR and subj normal AF  Assessment: pt doing well  Plan: 1. Supervision of other normal pregnancy, antepartum Routine care.  - CBC/D/Plt+RPR+Rh+ABO+Rub Ab... - Culture, OB Urine - Genetic Screening - GC/Chlamydia probe amp ()not at [facility] - Hemoglobin A1c - TSH - [place] MFM OB COMP + 14 WK; Future - Comprehensive metabolic panel - Protein / creatinine ratio, urine  Problem list reviewed and updated.  Follow up in 3 weeks.  >50% of 30 min visit spent on counseling and coordination of care.     Cornelia Copa MD Attending Center for [facility])

## 2020-06-17 NOTE — Patient Instructions (Signed)
Your due date is September 12th  Today you are 12 weeks and 0 days

## 2020-06-17 NOTE — Progress Notes (Signed)
DATING AND VIABILITY SONOGRAM   Tami Clark is a 25 y.o. year old G2P1001 with LMP Patient's last menstrual period was 03/25/2020. which would correlate to  [redacted]w[redacted]d weeks gestation.  She has regular menstrual cycles.   She is here today for a confirmatory initial sonogram.    GESTATION: SINGLETON yes     FETAL ACTIVITY:          Heart rate      156          The fetus is active.   GESTATIONAL AGE AND  BIOMETRICS:  Gestational criteria: Estimated Date of Delivery: 12/30/20 by LMP now at [redacted]w[redacted]d  Previous Scans:0  GESTATIONAL SAC            mm         weeks  CROWN RUMP LENGTH           5.42cm        12.0 weeks                                                   AVERAGE EGA(BY THIS SCAN):  12.0 weeks  WORKING EDD( LMP ):  12/30/2020     TECHNICIAN COMMENTS:  Patient informed that the ultrasound is considered a limited obstetric ultrasound and is not intended to be a complete ultrasound exam.  Patient also informed that the ultrasound is not being completed with the intent of assessing for fetal or placental anomalies or any pelvic abnormalities. Explained that the purpose of today's ultrasound is to assess for fetal heart rate.  Patient acknowledges the purpose of the exam and the limitations of the study.           A copy of this report including all images has been saved and backed up to a second source for retrieval if needed. All measures and details of the anatomical scan, placentation, fluid volume and pelvic anatomy are contained in that report.  Scheryl Marten 06/17/2020 1:54 PM

## 2020-06-18 LAB — PROTEIN / CREATININE RATIO, URINE
Creatinine, Urine: 217 mg/dL
Protein, Ur: 14.1 mg/dL
Protein/Creat Ratio: 65 mg/g creat (ref 0–200)

## 2020-06-18 LAB — CBC/D/PLT+RPR+RH+ABO+RUB AB...
Antibody Screen: NEGATIVE
Basophils Absolute: 0 10*3/uL (ref 0.0–0.2)
Basos: 0 %
EOS (ABSOLUTE): 0 10*3/uL (ref 0.0–0.4)
Eos: 0 %
HCV Ab: <0.1 s/co ratio (ref 0.0–0.9)
HIV Screen 4th Generation wRfx: NONREACTIVE
Hematocrit: 38.2 % (ref 34.0–46.6)
Hemoglobin: 13 g/dL (ref 11.1–15.9)
Hepatitis B Surface Ag: NEGATIVE
Immature Grans (Abs): 0.1 10*3/uL (ref 0.0–0.1)
Immature Granulocytes: 1 %
Lymphocytes Absolute: 2 10*3/uL (ref 0.7–3.1)
Lymphs: 18 %
MCH: 30 pg (ref 26.6–33.0)
MCHC: 34 g/dL (ref 31.5–35.7)
MCV: 88 fL (ref 79–97)
Monocytes Absolute: 0.6 10*3/uL (ref 0.1–0.9)
Monocytes: 5 %
Neutrophils Absolute: 8.3 10*3/uL — ABNORMAL HIGH (ref 1.4–7.0)
Neutrophils: 76 %
Platelets: 306 10*3/uL (ref 150–450)
RBC: 4.33 x10E6/uL (ref 3.77–5.28)
RDW: 12.1 % (ref 11.7–15.4)
RPR Ser Ql: NONREACTIVE
Rh Factor: POSITIVE
Rubella Antibodies, IGG: 2.22 index (ref >0.99)
WBC: 10.9 10*3/uL — ABNORMAL HIGH (ref 3.4–10.8)

## 2020-06-18 LAB — GC/CHLAMYDIA PROBE AMP (~~LOC~~) NOT AT ARMC
Chlamydia: NEGATIVE
Comment: NEGATIVE
Comment: NORMAL
Neisseria Gonorrhea: NEGATIVE

## 2020-06-18 LAB — COMPREHENSIVE METABOLIC PANEL
ALT: 7 IU/L (ref 0–32)
AST: 13 IU/L (ref 0–40)
Albumin/Globulin Ratio: 1.9 (ref 1.2–2.2)
Albumin: 4.9 g/dL (ref 3.9–5.0)
Alkaline Phosphatase: 61 IU/L (ref 44–121)
BUN/Creatinine Ratio: 11 (ref 9–23)
BUN: 8 mg/dL (ref 6–20)
Bilirubin Total: 0.3 mg/dL (ref 0.0–1.2)
CO2: 20 mmol/L (ref 20–29)
Calcium: 10 mg/dL (ref 8.7–10.2)
Chloride: 101 mmol/L (ref 96–106)
Creatinine, Ser: 0.71 mg/dL (ref 0.57–1.00)
Globulin, Total: 2.6 g/dL (ref 1.5–4.5)
Glucose: 68 mg/dL (ref 65–99)
Potassium: 4 mmol/L (ref 3.5–5.2)
Sodium: 136 mmol/L (ref 134–144)
Total Protein: 7.5 g/dL (ref 6.0–8.5)
eGFR: 122 mL/min/{1.73_m2} (ref >59)

## 2020-06-18 LAB — HEMOGLOBIN A1C
Est. average glucose Bld gHb Est-mCnc: 105 mg/dL
Hgb A1c MFr Bld: 5.3 % (ref 4.8–5.6)

## 2020-06-18 LAB — TSH: TSH: 1.3 u[IU]/mL (ref 0.450–4.500)

## 2020-06-18 LAB — HCV INTERPRETATION

## 2020-06-19 LAB — URINE CULTURE, OB REFLEX

## 2020-06-19 LAB — CULTURE, OB URINE

## 2020-06-24 ENCOUNTER — Telehealth: Payer: Self-pay | Admitting: Radiology

## 2020-06-24 ENCOUNTER — Encounter: Payer: Self-pay | Admitting: Radiology

## 2020-06-24 NOTE — Telephone Encounter (Signed)
Patient was informed of Panorama results did not want to be informed of fetal sex. Placed in envelope for pick up

## 2020-07-17 ENCOUNTER — Ambulatory Visit (INDEPENDENT_AMBULATORY_CARE_PROVIDER_SITE_OTHER): Payer: BC Managed Care – PPO | Admitting: Student

## 2020-07-17 ENCOUNTER — Other Ambulatory Visit: Payer: Self-pay

## 2020-07-17 VITALS — BP 117/77 | HR 98 | Wt 172.0 lb

## 2020-07-17 DIAGNOSIS — Z3A16 16 weeks gestation of pregnancy: Secondary | ICD-10-CM

## 2020-07-17 DIAGNOSIS — Z3480 Encounter for supervision of other normal pregnancy, unspecified trimester: Secondary | ICD-10-CM | POA: Diagnosis not present

## 2020-07-17 NOTE — Progress Notes (Signed)
   PRENATAL VISIT NOTE  Subjective:  Tami Clark is a 25 y.o. G2P1001 at [redacted]w[redacted]d being seen today for ongoing prenatal care.  She is currently monitored for the following issues for this low-risk pregnancy and has Supervision of other normal pregnancy, antepartum; Obesity (BMI 30-39.9); and Obesity in pregnancy on their problem list.  Patient reports no complaints.  Contractions: Not present. Vag. Bleeding: None.   . Denies leaking of fluid.   The following portions of the patient's history were reviewed and updated as appropriate: allergies, current medications, past family history, past medical history, past social history, past surgical history and problem list.   Objective:   Vitals:   07/17/20 1323  BP: 117/77  Pulse: 98  Weight: 172 lb (78 kg)    Fetal Status: Fetal Heart Rate (bpm): 142         General:  Alert, oriented and cooperative. Patient is in no acute distress.  Skin: Skin is warm and dry. No rash noted.   Cardiovascular: Normal heart rate noted  Respiratory: Normal respiratory effort, no problems with respiration noted  Abdomen: Soft, gravid, appropriate for gestational age.  Pain/Pressure: Absent     Pelvic: Cervical exam deferred        Extremities: Normal range of motion.  Edema: None  Mental Status: Normal mood and affect. Normal behavior. Normal judgment and thought content.   Assessment and Plan:  Pregnancy: G2P1001 at [redacted]w[redacted]d 1. Supervision of other normal pregnancy, antepartum -keep [place] appt on 4/19 -considering IUD inpatient Preterm labor symptoms and general obstetric precautions including but not limited to vaginal bleeding, contractions, leaking of fluid and fetal movement were reviewed in detail with the patient. Please refer to After Visit Summary for other counseling recommendations.   No follow-ups on file.  Future Appointments  Date Time Provider Department Center  08/06/2020  1:45 PM [facility] US[facility]  08/14/2020  1:30 PM Jaquia Benedicto,  Charlesetta Garibaldi, CNM [facility]    Marylene Land, CNM

## 2020-07-19 LAB — AFP, SERUM, OPEN SPINA BIFIDA
AFP MoM: 0.92
AFP Value: 33.3 ng/mL
Gest. Age on Collection Date: 16.2 weeks
Maternal Age At EDD: 23.9 yr
OSBR Risk 1 IN: 10000
Test Results:: NEGATIVE
Weight: 172 [lb_av]

## 2020-08-06 ENCOUNTER — Other Ambulatory Visit: Payer: Self-pay | Admitting: Obstetrics and Gynecology

## 2020-08-06 ENCOUNTER — Ambulatory Visit: Payer: BC Managed Care – PPO | Attending: Obstetrics and Gynecology

## 2020-08-06 ENCOUNTER — Other Ambulatory Visit: Payer: Self-pay

## 2020-08-06 ENCOUNTER — Other Ambulatory Visit: Payer: Self-pay | Admitting: *Deleted

## 2020-08-06 DIAGNOSIS — Z3480 Encounter for supervision of other normal pregnancy, unspecified trimester: Secondary | ICD-10-CM | POA: Diagnosis not present

## 2020-08-06 DIAGNOSIS — O4440 Low lying placenta NOS or without hemorrhage, unspecified trimester: Secondary | ICD-10-CM

## 2020-08-07 ENCOUNTER — Encounter: Payer: Self-pay | Admitting: Obstetrics and Gynecology

## 2020-08-07 DIAGNOSIS — O4440 Low lying placenta NOS or without hemorrhage, unspecified trimester: Secondary | ICD-10-CM | POA: Insufficient documentation

## 2020-08-07 HISTORY — DX: Low lying placenta nos or without hemorrhage, unspecified trimester: O44.40

## 2020-08-14 ENCOUNTER — Other Ambulatory Visit: Payer: Self-pay

## 2020-08-14 ENCOUNTER — Ambulatory Visit (INDEPENDENT_AMBULATORY_CARE_PROVIDER_SITE_OTHER): Payer: BC Managed Care – PPO | Admitting: Student

## 2020-08-14 VITALS — BP 124/76 | HR 84 | Wt 175.0 lb

## 2020-08-14 DIAGNOSIS — Z3A20 20 weeks gestation of pregnancy: Secondary | ICD-10-CM

## 2020-08-14 NOTE — Progress Notes (Signed)
   PRENATAL VISIT NOTE  Subjective:  Tami Clark is a 25 y.o. G2P1001 at [redacted]w[redacted]d being seen today for ongoing prenatal care.  She is currently monitored for the following issues for this low-risk pregnancy and has Supervision of other normal pregnancy, antepartum; Obesity (BMI 30-39.9); Obesity in pregnancy; and Low-lying placenta on their problem list.  Patient reports no complaints.  Contractions: Not present. Vag. Bleeding: None.  Movement: Present. Denies leaking of fluid.   The following portions of the patient's history were reviewed and updated as appropriate: allergies, current medications, past family history, past medical history, past social history, past surgical history and problem list.   Objective:   Vitals:   08/14/20 1343  BP: 124/76  Pulse: 84  Weight: 175 lb (79.4 kg)    Fetal Status: Fetal Heart Rate (bpm): 140 Fundal Height: 20 cm Movement: Present     General:  Alert, oriented and cooperative. Patient is in no acute distress.  Skin: Skin is warm and dry. No rash noted.   Cardiovascular: Normal heart rate noted  Respiratory: Normal respiratory effort, no problems with respiration noted  Abdomen: Soft, gravid, appropriate for gestational age.  Pain/Pressure: Absent     Pelvic: Cervical exam deferred        Extremities: Normal range of motion.  Edema: None  Mental Status: Normal mood and affect. Normal behavior. Normal judgment and thought content.   Assessment and Plan:  Pregnancy: G2P1001 at [redacted]w[redacted]d 1. [redacted] weeks gestation of pregnancy    -discussed that placenta would most likely move up with growing gestation -patient can have virtual visit next visit, anticipatory guideline for 2 hour GTT given for 8 weeks from now  Preterm labor symptoms and general obstetric precautions including but not limited to vaginal bleeding, contractions, leaking of fluid and fetal movement were reviewed in detail with the patient. Please refer to After Visit Summary for other  counseling recommendations.   Return in about 4 weeks (around 09/11/2020), or LROB on my chart.  Future Appointments  Date Time Provider Department Center  09/12/2020  1:45 PM Reva Bores, MD [facility]  09/26/2020 12:30 PM [facility] NURSE [facility]  09/26/2020 12:45 PM [facility] US5 [facility]    Marylene Land, CNM

## 2020-09-12 ENCOUNTER — Telehealth: Payer: BC Managed Care – PPO | Admitting: Family Medicine

## 2020-09-17 ENCOUNTER — Telehealth: Payer: BC Managed Care – PPO | Admitting: Obstetrics and Gynecology

## 2020-09-23 ENCOUNTER — Telehealth: Payer: Self-pay | Admitting: Radiology

## 2020-09-23 NOTE — Telephone Encounter (Signed)
Left message for patient to call [facility] to reschedule missed appointment from 09/17/20.

## 2020-09-25 ENCOUNTER — Ambulatory Visit (INDEPENDENT_AMBULATORY_CARE_PROVIDER_SITE_OTHER): Payer: BC Managed Care – PPO | Admitting: Obstetrics and Gynecology

## 2020-09-25 ENCOUNTER — Other Ambulatory Visit: Payer: Self-pay

## 2020-09-25 DIAGNOSIS — Z3480 Encounter for supervision of other normal pregnancy, unspecified trimester: Secondary | ICD-10-CM

## 2020-09-25 NOTE — Progress Notes (Signed)
   PRENATAL VISIT NOTE  Subjective:  Tami Clark is a 25 y.o. G2P1001 at [redacted]w[redacted]d being seen today for ongoing prenatal care.  She is currently monitored for the following issues for this low-risk pregnancy and has Supervision of other normal pregnancy, antepartum; Obesity (BMI 30-39.9); Obesity in pregnancy; and Low-lying placenta on their problem list.  Patient reports no complaints.  Contractions: Not present. Vag. Bleeding: None.  Movement: Present. Denies leaking of fluid.   The following portions of the patient's history were reviewed and updated as appropriate: allergies, current medications, past family history, past medical history, past social history, past surgical history and problem list.   Objective:   Vitals:   09/25/20 1127  BP: 127/81  Pulse: 80  Weight: 178 lb (80.7 kg)    Fetal Status: Fetal Heart Rate (bpm): 143 Fundal Height: 26 cm Movement: Present     General:  Alert, oriented and cooperative. Patient is in no acute distress.  Skin: Skin is warm and dry. No rash noted.   Cardiovascular: Normal heart rate noted  Respiratory: Normal respiratory effort, no problems with respiration noted  Abdomen: Soft, gravid, appropriate for gestational age.  Pain/Pressure: Absent     Pelvic: Cervical exam deferred        Extremities: Normal range of motion.     Mental Status: Normal mood and affect. Normal behavior. Normal judgment and thought content.   Assessment and Plan:  Pregnancy: G2P1001 at [redacted]w[redacted]d  1. Supervision of other normal pregnancy, antepartum  2 hour glucose testing in 3 weeks Will check CBC for anemia, she had this with last pregnancy.    Preterm labor symptoms and general obstetric precautions including but not limited to vaginal bleeding, contractions, leaking of fluid and fetal movement were reviewed in detail with the patient. Please refer to After Visit Summary for other counseling recommendations.   Return in about 3 weeks (around 10/16/2020) for  Come fasting for 2 hour GTT .  Future Appointments  Date Time Provider Department Center  09/26/2020 12:30 PM [facility] NURSE [facility]  09/26/2020 12:45 PM [facility] US5 [facility]  10/15/2020  8:15 AM Anyanwu, Jethro Bastos, MD [facility]    Venia Carbon, NP

## 2020-09-26 ENCOUNTER — Ambulatory Visit: Payer: BC Managed Care – PPO | Attending: Obstetrics

## 2020-09-26 ENCOUNTER — Other Ambulatory Visit: Payer: Self-pay | Admitting: Obstetrics

## 2020-09-26 ENCOUNTER — Ambulatory Visit: Payer: BC Managed Care – PPO | Admitting: *Deleted

## 2020-09-26 ENCOUNTER — Encounter: Payer: Self-pay | Admitting: *Deleted

## 2020-09-26 ENCOUNTER — Other Ambulatory Visit: Payer: Self-pay

## 2020-09-26 VITALS — BP 113/68 | HR 81

## 2020-09-26 DIAGNOSIS — O99012 Anemia complicating pregnancy, second trimester: Secondary | ICD-10-CM | POA: Diagnosis not present

## 2020-09-26 DIAGNOSIS — D649 Anemia, unspecified: Secondary | ICD-10-CM | POA: Diagnosis not present

## 2020-09-26 DIAGNOSIS — Z3A26 26 weeks gestation of pregnancy: Secondary | ICD-10-CM

## 2020-09-26 DIAGNOSIS — O4440 Low lying placenta NOS or without hemorrhage, unspecified trimester: Secondary | ICD-10-CM | POA: Diagnosis not present

## 2020-09-26 DIAGNOSIS — O99212 Obesity complicating pregnancy, second trimester: Secondary | ICD-10-CM | POA: Diagnosis not present

## 2020-09-26 DIAGNOSIS — Z6831 Body mass index (BMI) 31.0-31.9, adult: Secondary | ICD-10-CM | POA: Diagnosis not present

## 2020-09-26 DIAGNOSIS — Z362 Encounter for other antenatal screening follow-up: Secondary | ICD-10-CM

## 2020-09-26 DIAGNOSIS — E669 Obesity, unspecified: Secondary | ICD-10-CM

## 2020-09-27 ENCOUNTER — Other Ambulatory Visit: Payer: Self-pay | Admitting: *Deleted

## 2020-09-27 DIAGNOSIS — O4440 Low lying placenta NOS or without hemorrhage, unspecified trimester: Secondary | ICD-10-CM

## 2020-10-15 ENCOUNTER — Other Ambulatory Visit: Payer: Self-pay

## 2020-10-15 ENCOUNTER — Ambulatory Visit (INDEPENDENT_AMBULATORY_CARE_PROVIDER_SITE_OTHER): Payer: BC Managed Care – PPO | Admitting: Obstetrics & Gynecology

## 2020-10-15 ENCOUNTER — Encounter: Payer: Self-pay | Admitting: Obstetrics & Gynecology

## 2020-10-15 VITALS — BP 106/73 | HR 89 | Wt 178.0 lb

## 2020-10-15 DIAGNOSIS — O4440 Low lying placenta NOS or without hemorrhage, unspecified trimester: Secondary | ICD-10-CM

## 2020-10-15 DIAGNOSIS — Z3480 Encounter for supervision of other normal pregnancy, unspecified trimester: Secondary | ICD-10-CM

## 2020-10-15 DIAGNOSIS — Z23 Encounter for immunization: Secondary | ICD-10-CM | POA: Diagnosis not present

## 2020-10-15 DIAGNOSIS — Z3A29 29 weeks gestation of pregnancy: Secondary | ICD-10-CM

## 2020-10-15 NOTE — Patient Instructions (Signed)
Return to office for any scheduled appointments. Call the office or go to the MAU at [facility] at [facility] if: You begin to have strong, frequent contractions Your water breaks.  Sometimes it is a big gush of fluid, sometimes it is just a trickle that keeps getting your panties wet or running down your legs You have vaginal bleeding.  It is normal to have a small amount of spotting if your cervix was checked.  You do not feel your baby moving like normal.  If you do not, get something to eat and drink and lay down and focus on feeling your baby move.   If your baby is still not moving like normal, you should call the office or go to MAU. Any other obstetric concerns. [email or web address]">  Third Trimester of Pregnancy  The third trimester of pregnancy is from week 28 through week 40. This is months 7 through 9. The third trimester is a time when the unborn baby (fetus) is growing rapidly. At the end of the ninth month, the fetus is about 20inches long and weighs 6-10 pounds. Body changes during your third trimester During the third trimester, your body will continue to go through many changes.The changes vary and generally return to normal after your baby is born. Physical changes Your weight will continue to increase. You can expect to gain 25-35 pounds (11-16 kg) by the end of the pregnancy if you begin pregnancy at a normal weight. If you are underweight, you can expect to gain 28-40 lb (about 13-18 kg), and if you are overweight, you can expect to gain 15-25 lb (about 7-11 kg). You may begin to get stretch marks on your hips, abdomen, and breasts. Your breasts will continue to grow and may hurt. A yellow fluid (colostrum) may leak from your breasts. This is the first milk you are producing for your baby. You may have changes in your hair. These can include thickening of your hair, rapid growth, and changes in texture. Some  people also have hair loss during or after pregnancy, or hair that feels dry or thin. Your belly button may stick out. You may notice more swelling in your hands, face, or ankles. Health changes You may have heartburn. You may have constipation. You may develop hemorrhoids. You may develop swollen, bulging veins (varicose veins) in your legs. You may have increased body aches in the pelvis, back, or thighs. This is due to weight gain and increased hormones that are relaxing your joints. You may have increased tingling or numbness in your hands, arms, and legs. The skin on your abdomen may also feel numb. You may feel short of breath because of your expanding uterus. Other changes You may urinate more often because the fetus is moving lower into your pelvis and pressing on your bladder. You may have more problems sleeping. This may be caused by the size of your abdomen, an increased need to urinate, and an increase in your body's metabolism. You may notice the fetus "dropping," or moving lower in your abdomen (lightening). You may have increased vaginal discharge. You may notice that you have pain around your pelvic bone as your uterus distends. Follow these instructions at home: Medicines Follow your health care provider's instructions regarding medicine use. Specific medicines may be either safe or unsafe to take during pregnancy. Do not take any medicines unless approved by your health care provider. Take a prenatal vitamin that contains at least 600 micrograms (mcg) of folic acid. Eating  and drinking Eat a healthy diet that includes fresh fruits and vegetables, whole grains, good sources of protein such as meat, eggs, or tofu, and low-fat dairy products. Avoid raw meat and unpasteurized juice, milk, and cheese. These carry germs that can harm you and your baby. Eat 4 or 5 small meals rather than 3 large meals a day. You may need to take these actions to prevent or treat  constipation: Drink enough fluid to keep your urine pale yellow. Eat foods that are high in fiber, such as beans, whole grains, and fresh fruits and vegetables. Limit foods that are high in fat and processed sugars, such as fried or sweet foods. Activity Exercise only as directed by your health care provider. Most people can continue their usual exercise routine during pregnancy. Try to exercise for 30 minutes at least 5 days a week. Stop exercising if you experience contractions in the uterus. Stop exercising if you develop pain or cramping in the lower abdomen or lower back. Avoid heavy lifting. Do not exercise if it is very hot or humid or if you are at a high altitude. If you choose to, you may continue to have sex unless your health care provider tells you not to. Relieving pain and discomfort Take frequent breaks and rest with your legs raised (elevated) if you have leg cramps or low back pain. Take warm sitz baths to soothe any pain or discomfort caused by hemorrhoids. Use hemorrhoid cream if your health care provider approves. Wear a supportive bra to prevent discomfort from breast tenderness. If you develop varicose veins: Wear support hose as told by your health care provider. Elevate your feet for 15 minutes, 3-4 times a day. Limit salt in your diet. Safety Talk to your health care provider before traveling far distances. Do not use hot tubs, steam rooms, or saunas. Wear your seat belt at all times when driving or riding in a car. Talk with your health care provider if someone is verbally or physically abusive to you. Preparing for birth To prepare for the arrival of your baby: Take prenatal classes to understand, practice, and ask questions about labor and delivery. Visit the hospital and tour the maternity area. Purchase a rear-facing car seat and make sure you know how to install it in your car. Prepare the baby's room or sleeping area. Make sure to remove all pillows and  stuffed animals from the baby's crib to prevent suffocation. General instructions Avoid cat litter boxes and soil used by cats. These carry germs that can cause birth defects in the baby. If you have a cat, ask someone to clean the litter box for you. Do not douche or use tampons. Do not use scented sanitary pads. Do not use any products that contain nicotine or tobacco, such as cigarettes, e-cigarettes, and chewing tobacco. If you need help quitting, ask your health care provider. Do not use any herbal remedies, illegal drugs, or medicines that were not prescribed to you. Chemicals in these products can harm your baby. Do not drink alcohol. You will have more frequent prenatal exams during the third trimester. During a routine prenatal visit, your health care provider will do a physical exam, perform tests, and discuss your overall health. Keep all follow-up visits. This is important. Where to find more information American Pregnancy Association: americanpregnancy.org [employer]ians [employer] Gynecologists: https://www.todd-brady.net/ Office on [employer] Health: MightyReward.co.nz Contact a health care provider if you have: A fever. Mild pelvic cramps, pelvic pressure, or nagging pain in  your abdominal area or lower back. Vomiting or diarrhea. Bad-smelling vaginal discharge or foul-smelling urine. Pain when you urinate. A headache that does not go away when you take medicine. Visual changes or see spots in front of your eyes. Get help right away if: Your water breaks. You have regular contractions less than 5 minutes apart. You have spotting or bleeding from your vagina. You have severe abdominal pain. You have difficulty breathing. You have chest pain. You have fainting spells. You have not felt your baby move for the time period told by your health care provider. You have new or increased pain, swelling, or redness in an arm or leg. Summary The third  trimester of pregnancy is from week 28 through week 40 (months 7 through 9). You may have more problems sleeping. This can be caused by the size of your abdomen, an increased need to urinate, and an increase in your body's metabolism. You will have more frequent prenatal exams during the third trimester. Keep all follow-up visits. This is important. This information is not intended to replace advice given to you by your health care provider. Make sure you discuss any questions you have with your healthcare provider. Document Revised: 09/13/2019 Document Reviewed: 07/20/2019 Elsevier Patient Education  2022 [employer].

## 2020-10-15 NOTE — Progress Notes (Signed)
   PRENATAL VISIT NOTE  Subjective:  Tami Clark is a 25 y.o. G2P1001 at [redacted]w[redacted]d being seen today for ongoing prenatal care.  She is currently monitored for the following issues for this low-risk pregnancy and has Supervision of other normal pregnancy, antepartum; Obesity in pregnancy; and Low-lying placenta on their problem list.  Patient reports no complaints.  Contractions: Not present. Vag. Bleeding: None.  Movement: Present. Denies leaking of fluid.   The following portions of the patient's history were reviewed and updated as appropriate: allergies, current medications, past family history, past medical history, past social history, past surgical history and problem list.   Objective:   Vitals:   10/15/20 0832  BP: 106/73  Pulse: 89  Weight: 178 lb (80.7 kg)    Fetal Status: Fetal Heart Rate (bpm): 128 Fundal Height: 29 cm Movement: Present     General:  Alert, oriented and cooperative. Patient is in no acute distress.  Skin: Skin is warm and dry. No rash noted.   Cardiovascular: Normal heart rate noted  Respiratory: Normal respiratory effort, no problems with respiration noted  Abdomen: Soft, gravid, appropriate for gestational age.  Pain/Pressure: Absent     Pelvic: Cervical exam deferred        Extremities: Normal range of motion.  Edema: None  Mental Status: Normal mood and affect. Normal behavior. Normal judgment and thought content.   Assessment and Plan:  Pregnancy: G2P1001 at [redacted]w[redacted]d 1. Low-lying placenta 4/19: posterior low lying placenta >>6/9 1.7 cm from os Follow up scan ordered for 11/07/2020. Bleeding precautions reviewed.  2. [redacted] weeks gestation of pregnancy 3. Supervision of other normal pregnancy, antepartum Third trimester labs and Tdap done today.  - Glucose Tolerance, 2 Hours w/1 Hour - CBC - RPR - HIV Antibody (routine testing w rflx) - Tdap Preterm labor symptoms and general obstetric precautions including but not limited to vaginal bleeding,  contractions, leaking of fluid and fetal movement were reviewed in detail with the patient. Please refer to After Visit Summary for other counseling recommendations.   Return in about 2 weeks (around 10/29/2020) for OFFICE OB VISIT (MD or APP).  Future Appointments  Date Time Provider Department Center  11/07/2020  3:00 PM [facility] NURSE [facility]  11/07/2020  3:15 PM [facility] US2 [facility]    Jaynie Collins, MD

## 2020-10-16 LAB — GLUCOSE TOLERANCE, 2 HOURS W/ 1HR
Glucose, 1 hour: 104 mg/dL (ref 65–179)
Glucose, 2 hour: 103 mg/dL (ref 65–152)
Glucose, Fasting: 83 mg/dL (ref 65–91)

## 2020-10-16 LAB — CBC
Hematocrit: 37.8 % (ref 34.0–46.6)
Hemoglobin: 13.5 g/dL (ref 11.1–15.9)
MCH: 31.1 pg (ref 26.6–33.0)
MCHC: 35.7 g/dL (ref 31.5–35.7)
MCV: 87 fL (ref 79–97)
Platelets: 178 10*3/uL (ref 150–450)
RBC: 4.34 x10E6/uL (ref 3.77–5.28)
RDW: 13 % (ref 11.7–15.4)
WBC: 9.5 10*3/uL (ref 3.4–10.8)

## 2020-10-16 LAB — RPR: RPR Ser Ql: NONREACTIVE

## 2020-10-16 LAB — HIV ANTIBODY (ROUTINE TESTING W REFLEX): HIV Screen 4th Generation wRfx: NONREACTIVE

## 2020-10-31 ENCOUNTER — Other Ambulatory Visit: Payer: Self-pay

## 2020-10-31 ENCOUNTER — Telehealth (INDEPENDENT_AMBULATORY_CARE_PROVIDER_SITE_OTHER): Payer: BC Managed Care – PPO | Admitting: Family Medicine

## 2020-10-31 VITALS — BP 116/74 | HR 84

## 2020-10-31 DIAGNOSIS — O4440 Low lying placenta NOS or without hemorrhage, unspecified trimester: Secondary | ICD-10-CM

## 2020-10-31 DIAGNOSIS — Z3480 Encounter for supervision of other normal pregnancy, unspecified trimester: Secondary | ICD-10-CM

## 2020-10-31 DIAGNOSIS — O4443 Low lying placenta NOS or without hemorrhage, third trimester: Secondary | ICD-10-CM

## 2020-10-31 DIAGNOSIS — Z3A31 31 weeks gestation of pregnancy: Secondary | ICD-10-CM

## 2020-10-31 NOTE — Progress Notes (Signed)
Pt unable to take BP, will send reading via My Chart later.

## 2020-10-31 NOTE — Progress Notes (Signed)
   OBSTETRICS PRENATAL VIRTUAL VISIT ENCOUNTER NOTE  Provider location: Center for [facility] at [facility]   Patient location: Home  I connected with Tami Clark on 10/31/20 at 10:30 AM EDT by MyChart Video Encounter and verified that I am speaking with the correct person using two identifiers. I discussed the limitations, risks, security and privacy concerns of performing an evaluation and management service virtually and the availability of in person appointments. I also discussed with the patient that there may be a patient responsible charge related to this service. The patient expressed understanding and agreed to proceed. Subjective:  Tami Clark is a 26 y.o. G2P1001 at [redacted]w[redacted]d being seen today for ongoing prenatal care.  She is currently monitored for the following issues for this low-risk pregnancy and has Supervision of other normal pregnancy, antepartum; Obesity in pregnancy; and Low-lying placenta on their problem list.  Patient reports no complaints.  Contractions: Not present. Vag. Bleeding: None.  Movement: Present. Denies any leaking of fluid.   The following portions of the patient's history were reviewed and updated as appropriate: allergies, current medications, past family history, past medical history, past social history, past surgical history and problem list.   Objective:  There were no vitals filed for this visit.  Fetal Status:     Movement: Present     General:  Alert, oriented and cooperative. Patient is in no acute distress.  Respiratory: Normal respiratory effort, no problems with respiration noted  Mental Status: Normal mood and affect. Normal behavior. Normal judgment and thought content.  Rest of physical exam deferred due to type of encounter  Imaging: No results found.  Assessment and Plan:  Pregnancy: G2P1001 at [redacted]w[redacted]d 1. Supervision of other normal pregnancy, antepartum Continue routine prenatal care.   2. Low-lying placenta For  repeat u/s in 2 wks Reviewed risks and delivery recommendations if < 1 cm, > 2cm and between 1-2 cm.   Preterm labor symptoms and general obstetric precautions including but not limited to vaginal bleeding, contractions, leaking of fluid and fetal movement were reviewed in detail with the patient. I discussed the assessment and treatment plan with the patient. The patient was provided an opportunity to ask questions and all were answered. The patient agreed with the plan and demonstrated an understanding of the instructions. The patient was advised to call back or seek an in-person office evaluation/go to MAU at [facility] for any urgent or concerning symptoms. Please refer to After Visit Summary for other counseling recommendations.   I provided 11 minutes of face-to-face time during this encounter.  Return in 2 weeks (on 11/14/2020).  Future Appointments  Date Time Provider Department Center  11/07/2020  3:00 PM [facility]URSE [facility]  11/07/2020  3:15 PM [facility] US2 [facility]  11/14/2020  8:15 AM [place] Bing, MD [facility]  11/28/2020  8:35 AM [place] Bing, MD [facility]  12/12/2020  8:35 AM Reva Bores, MD [facility]    Reva Bores, MD Center for [facility], [facility]

## 2020-10-31 NOTE — Patient Instructions (Signed)

## 2020-11-07 ENCOUNTER — Ambulatory Visit: Payer: BC Managed Care – PPO | Admitting: *Deleted

## 2020-11-07 ENCOUNTER — Other Ambulatory Visit: Payer: Self-pay

## 2020-11-07 ENCOUNTER — Ambulatory Visit: Payer: BC Managed Care – PPO | Attending: Obstetrics and Gynecology

## 2020-11-07 VITALS — BP 109/69 | HR 76

## 2020-11-07 DIAGNOSIS — O4440 Low lying placenta NOS or without hemorrhage, unspecified trimester: Secondary | ICD-10-CM | POA: Diagnosis not present

## 2020-11-07 DIAGNOSIS — O99213 Obesity complicating pregnancy, third trimester: Secondary | ICD-10-CM

## 2020-11-07 DIAGNOSIS — E669 Obesity, unspecified: Secondary | ICD-10-CM

## 2020-11-07 DIAGNOSIS — Z362 Encounter for other antenatal screening follow-up: Secondary | ICD-10-CM | POA: Diagnosis not present

## 2020-11-07 DIAGNOSIS — O99013 Anemia complicating pregnancy, third trimester: Secondary | ICD-10-CM

## 2020-11-07 DIAGNOSIS — O4443 Low lying placenta NOS or without hemorrhage, third trimester: Secondary | ICD-10-CM

## 2020-11-07 DIAGNOSIS — D649 Anemia, unspecified: Secondary | ICD-10-CM

## 2020-11-07 DIAGNOSIS — Z3A32 32 weeks gestation of pregnancy: Secondary | ICD-10-CM

## 2020-11-14 ENCOUNTER — Ambulatory Visit (INDEPENDENT_AMBULATORY_CARE_PROVIDER_SITE_OTHER): Payer: BC Managed Care – PPO | Admitting: Obstetrics and Gynecology

## 2020-11-14 ENCOUNTER — Encounter: Payer: Self-pay | Admitting: Obstetrics and Gynecology

## 2020-11-14 ENCOUNTER — Other Ambulatory Visit: Payer: Self-pay

## 2020-11-14 VITALS — BP 110/73 | HR 91 | Wt 181.0 lb

## 2020-11-14 DIAGNOSIS — O4440 Low lying placenta NOS or without hemorrhage, unspecified trimester: Secondary | ICD-10-CM

## 2020-11-14 DIAGNOSIS — Z3A33 33 weeks gestation of pregnancy: Secondary | ICD-10-CM

## 2020-11-14 NOTE — Progress Notes (Signed)
    PRENATAL VISIT NOTE  Subjective:  Tami Clark is a 25 y.o. G2P1001 at [redacted]w[redacted]d being seen today for ongoing prenatal care.  She is currently monitored for the following issues for this low-risk pregnancy and has Supervision of other normal pregnancy, antepartum and Obesity in pregnancy on their problem list.  Patient reports no complaints.  Contractions: Not present. Vag. Bleeding: None.  Movement: Present. Denies leaking of fluid.   The following portions of the patient's history were reviewed and updated as appropriate: allergies, current medications, past family history, past medical history, past social history, past surgical history and problem list.   Objective:   Vitals:   11/14/20 0827  BP: 110/73  Pulse: 91  Weight: 181 lb (82.1 kg)    Fetal Status: Fetal Heart Rate (bpm): 128 Fundal Height: 33 cm Movement: Present     General:  Alert, oriented and cooperative. Patient is in no acute distress.  Skin: Skin is warm and dry. No rash noted.   Cardiovascular: Normal heart rate noted  Respiratory: Normal respiratory effort, no problems with respiration noted  Abdomen: Soft, gravid, appropriate for gestational age.  Pain/Pressure: Absent     Pelvic: Cervical exam deferred        Extremities: Normal range of motion.  Edema: None  Mental Status: Normal mood and affect. Normal behavior. Normal judgment and thought content.   Assessment and Plan:  Pregnancy: G2P1001 at [redacted]w[redacted]d 1. Low-lying placenta Resolved. 7/21: afi 11.3, ceph, 1891g, 28%, ac 42%, resolved low placenta/previa  2. Pregnancy Leaning to PP IUD. GBS at 36wks  Term labor symptoms and general obstetric precautions including but not limited to vaginal bleeding, contractions, leaking of fluid and fetal movement were reviewed in detail with the patient. Please refer to After Visit Summary for other counseling recommendations.   Return in about 2 weeks (around 11/28/2020) for in person, low risk ob, md or  app.  Future Appointments  Date Time Provider Department Center  11/28/2020  8:35 AM [place] Bing, MD [facility]  12/12/2020  8:35 AM Reva Bores, MD [facility]    [place] Bing, MD

## 2020-11-28 ENCOUNTER — Ambulatory Visit (INDEPENDENT_AMBULATORY_CARE_PROVIDER_SITE_OTHER): Payer: BC Managed Care – PPO | Admitting: Obstetrics and Gynecology

## 2020-11-28 ENCOUNTER — Other Ambulatory Visit: Payer: Self-pay

## 2020-11-28 VITALS — BP 113/75 | HR 87 | Wt 184.0 lb

## 2020-11-28 DIAGNOSIS — Z3A35 35 weeks gestation of pregnancy: Secondary | ICD-10-CM

## 2020-11-28 DIAGNOSIS — O99210 Obesity complicating pregnancy, unspecified trimester: Secondary | ICD-10-CM

## 2020-11-28 DIAGNOSIS — Z3480 Encounter for supervision of other normal pregnancy, unspecified trimester: Secondary | ICD-10-CM

## 2020-11-28 NOTE — Progress Notes (Signed)
   PRENATAL VISIT NOTE  Subjective:  Tami Clark is a 25 y.o. G2P1001 at [redacted]w[redacted]d being seen today for ongoing prenatal care.  She is currently monitored for the following issues for this low-risk pregnancy and has Supervision of other normal pregnancy, antepartum and Obesity in pregnancy on their problem list.  Patient reports no complaints.  Contractions: Not present. Vag. Bleeding: None.  Movement: Present. Denies leaking of fluid.   The following portions of the patient's history were reviewed and updated as appropriate: allergies, current medications, past family history, past medical history, past social history, past surgical history and problem list.   Objective:   Vitals:   11/28/20 0839  BP: 113/75  Pulse: 87  Weight: 184 lb (83.5 kg)    Fetal Status: Fetal Heart Rate (bpm): 136 Fundal Height: 36 cm Movement: Present  Presentation: Vertex  General:  Alert, oriented and cooperative. Patient is in no acute distress.  Skin: Skin is warm and dry. No rash noted.   Cardiovascular: Normal heart rate noted  Respiratory: Normal respiratory effort, no problems with respiration noted  Abdomen: Soft, gravid, appropriate for gestational age.  Pain/Pressure: Absent     Pelvic: Cervical exam deferred        Extremities: Normal range of motion.     Mental Status: Normal mood and affect. Normal behavior. Normal judgment and thought content.   Assessment and Plan:  Pregnancy: G2P1001 at [redacted]w[redacted]d 1. [redacted] weeks gestation of pregnancy Gbs nv. Considering PP IUD  2. Supervision of other normal pregnancy, antepartum  Preterm labor symptoms and general obstetric precautions including but not limited to vaginal bleeding, contractions, leaking of fluid and fetal movement were reviewed in detail with the patient. Please refer to After Visit Summary for other counseling recommendations.   Return in about 1 week (around 12/05/2020) for 7-10d, md or app, low risk ob.  Future Appointments  Date Time  Provider Department Center  12/12/2020  8:35 AM Reva Bores, MD [facility]  12/19/2020  8:35 AM [place] Bing, MD [facility]    [place] Bing, MD

## 2020-12-12 ENCOUNTER — Other Ambulatory Visit: Payer: Self-pay

## 2020-12-12 ENCOUNTER — Other Ambulatory Visit (HOSPITAL_COMMUNITY)
Admission: RE | Admit: 2020-12-12 | Discharge: 2020-12-12 | Disposition: A | Discharge status: Home / Self Care | Payer: BC Managed Care – PPO | Source: Ambulatory Visit | Attending: Family Medicine | Admitting: Family Medicine

## 2020-12-12 ENCOUNTER — Ambulatory Visit (INDEPENDENT_AMBULATORY_CARE_PROVIDER_SITE_OTHER): Payer: BC Managed Care – PPO | Admitting: Family Medicine

## 2020-12-12 ENCOUNTER — Encounter: Payer: Self-pay | Admitting: Family Medicine

## 2020-12-12 VITALS — BP 107/74 | HR 99 | Wt 185.0 lb

## 2020-12-12 DIAGNOSIS — Z3480 Encounter for supervision of other normal pregnancy, unspecified trimester: Secondary | ICD-10-CM

## 2020-12-12 DIAGNOSIS — Z3A36 36 weeks gestation of pregnancy: Secondary | ICD-10-CM

## 2020-12-12 NOTE — Patient Instructions (Signed)

## 2020-12-12 NOTE — Progress Notes (Signed)
Pt presents for ROB, GBS, and GC/CT.  No concerns today per pt.  Flu vaccine offered; pt declined.

## 2020-12-12 NOTE — Progress Notes (Signed)
   PRENATAL VISIT NOTE  Subjective:  Tami Clark is a 25 y.o. G2P1001 at [redacted]w[redacted]d being seen today for ongoing prenatal care.  She is currently monitored for the following issues for this low-risk pregnancy and has Supervision of other normal pregnancy, antepartum and Obesity in pregnancy on their problem list.  Patient reports no complaints.  Contractions: Not present. Vag. Bleeding: None.  Movement: Present. Denies leaking of fluid.   The following portions of the patient's history were reviewed and updated as appropriate: allergies, current medications, past family history, past medical history, past social history, past surgical history and problem list.   Objective:   Vitals:   12/12/20 0841  BP: 107/74  Pulse: 99  Weight: 185 lb (83.9 kg)    Fetal Status: Fetal Heart Rate (bpm): 142 Fundal Height: 33 cm Movement: Present  Presentation: Vertex  General:  Alert, oriented and cooperative. Patient is in no acute distress.  Skin: Skin is warm and dry. No rash noted.   Cardiovascular: Normal heart rate noted  Respiratory: Normal respiratory effort, no problems with respiration noted  Abdomen: Soft, gravid, appropriate for gestational age.  Pain/Pressure: Absent     Pelvic: Cervical exam performed in the presence of a chaperone Dilation: 1 Effacement (%): 50 Station: -2  Extremities: Normal range of motion.  Edema: None  Mental Status: Normal mood and affect. Normal behavior. Normal judgment and thought content.   Assessment and Plan:  Pregnancy: G2P1001 at [redacted]w[redacted]d 1. Supervision of other normal pregnancy, antepartum Cultures today - Cervicovaginal ancillary only( [place]) - Strep Gp B NAA  2. [redacted] weeks gestation of pregnancy   Term labor symptoms and general obstetric precautions including but not limited to vaginal bleeding, contractions, leaking of fluid and fetal movement were reviewed in detail with the patient. Please refer to After Visit Summary for other counseling  recommendations.   Return in 1 week (on 12/19/2020).  Future Appointments  Date Time Provider Department Center  12/19/2020  8:35 AM [place] Bing, MD [facility]  12/26/2020  2:30 PM Reva Bores, MD [facility]    Reva Bores, MD

## 2020-12-13 LAB — CERVICOVAGINAL ANCILLARY ONLY
Chlamydia: NEGATIVE
Comment: NEGATIVE
Comment: NORMAL
Neisseria Gonorrhea: NEGATIVE

## 2020-12-14 LAB — STREP GP B NAA: Strep Gp B NAA: NEGATIVE

## 2020-12-19 ENCOUNTER — Ambulatory Visit (INDEPENDENT_AMBULATORY_CARE_PROVIDER_SITE_OTHER): Payer: BC Managed Care – PPO | Admitting: Obstetrics and Gynecology

## 2020-12-19 ENCOUNTER — Other Ambulatory Visit: Payer: Self-pay

## 2020-12-19 ENCOUNTER — Encounter: Payer: Self-pay | Admitting: Obstetrics and Gynecology

## 2020-12-19 VITALS — BP 114/75 | HR 91 | Wt 185.0 lb

## 2020-12-19 DIAGNOSIS — Z3A38 38 weeks gestation of pregnancy: Secondary | ICD-10-CM

## 2020-12-19 DIAGNOSIS — Z3480 Encounter for supervision of other normal pregnancy, unspecified trimester: Secondary | ICD-10-CM

## 2020-12-19 NOTE — Progress Notes (Signed)
   PRENATAL VISIT NOTE  Subjective:  Tami Clark is a 25 y.o. G2P1001 at [redacted]w[redacted]d being seen today for ongoing prenatal care.  She is currently monitored for the following issues for this low-risk pregnancy and has Supervision of other normal pregnancy, antepartum and Obesity in pregnancy on their problem list.  Patient reports no complaints.  Contractions: Not present. Vag. Bleeding: None.  Movement: Present. Denies leaking of fluid.   The following portions of the patient's history were reviewed and updated as appropriate: allergies, current medications, past family history, past medical history, past social history, past surgical history and problem list.   Objective:   Vitals:   12/19/20 0843  BP: 114/75  Pulse: 91  Weight: 185 lb (83.9 kg)    Fetal Status: Fetal Heart Rate (bpm): 136 Fundal Height: 38 cm Movement: Present  Presentation: Vertex  General:  Alert, oriented and cooperative. Patient is in no acute distress.  Skin: Skin is warm and dry. No rash noted.   Cardiovascular: Normal heart rate noted  Respiratory: Normal respiratory effort, no problems with respiration noted  Abdomen: Soft, gravid, appropriate for gestational age.  Pain/Pressure: Absent     Pelvic: Cervical exam deferred        Extremities: Normal range of motion.  Edema: None  Mental Status: Normal mood and affect. Normal behavior. Normal judgment and thought content.   Assessment and Plan:  Pregnancy: G2P1001 at [redacted]w[redacted]d 1. Supervision of other normal pregnancy, antepartum I d/w her setting up IOL nv. GBS neg  2. [redacted] weeks gestation of pregnancy  Term labor symptoms and general obstetric precautions including but not limited to vaginal bleeding, contractions, leaking of fluid and fetal movement were reviewed in detail with the patient. Please refer to After Visit Summary for other counseling recommendations.   Return in about 1 week (around 12/26/2020) for in person, low risk ob, md or app.  Future  Appointments  Date Time Provider Department Center  12/26/2020  2:30 PM Reva Bores, MD [facility]    Fayette Bing, MD

## 2020-12-26 ENCOUNTER — Inpatient Hospital Stay (HOSPITAL_COMMUNITY)
Admission: AD | Admit: 2020-12-26 | Discharge: 2020-12-27 | DRG: 807 | Disposition: A | Discharge status: Home / Self Care | Payer: BC Managed Care – PPO | Attending: Obstetrics & Gynecology | Admitting: Obstetrics & Gynecology

## 2020-12-26 ENCOUNTER — Encounter: Payer: BC Managed Care – PPO | Admitting: Family Medicine

## 2020-12-26 ENCOUNTER — Other Ambulatory Visit: Payer: Self-pay

## 2020-12-26 ENCOUNTER — Encounter (HOSPITAL_COMMUNITY): Payer: Self-pay | Admitting: Obstetrics & Gynecology

## 2020-12-26 DIAGNOSIS — Z20822 Contact with and (suspected) exposure to covid-19: Secondary | ICD-10-CM | POA: Diagnosis present

## 2020-12-26 DIAGNOSIS — Z8616 Personal history of COVID-19: Secondary | ICD-10-CM

## 2020-12-26 DIAGNOSIS — Z23 Encounter for immunization: Secondary | ICD-10-CM | POA: Diagnosis not present

## 2020-12-26 DIAGNOSIS — Z3A39 39 weeks gestation of pregnancy: Secondary | ICD-10-CM | POA: Diagnosis not present

## 2020-12-26 DIAGNOSIS — O479 False labor, unspecified: Secondary | ICD-10-CM | POA: Diagnosis present

## 2020-12-26 LAB — CBC
HCT: 29.7 % — ABNORMAL LOW (ref 36.0–46.0)
Hemoglobin: 9.9 g/dL — ABNORMAL LOW (ref 12.0–15.0)
MCH: 30.4 pg (ref 26.0–34.0)
MCHC: 33.3 g/dL (ref 30.0–36.0)
MCV: 91.1 fL (ref 80.0–100.0)
Platelets: 242 10*3/uL (ref 150–400)
RBC: 3.26 MIL/uL — ABNORMAL LOW (ref 3.87–5.11)
RDW: 12.8 % (ref 11.5–15.5)
WBC: 17.3 10*3/uL — ABNORMAL HIGH (ref 4.0–10.5)
nRBC: 0 % (ref 0.0–0.2)

## 2020-12-26 LAB — TYPE AND SCREEN
ABO/RH(D): O POS
Antibody Screen: NEGATIVE

## 2020-12-26 LAB — RPR: RPR Ser Ql: NONREACTIVE

## 2020-12-26 LAB — RESP PANEL BY RT-PCR (FLU A&B, COVID) ARPGX2
Influenza A by PCR: NEGATIVE
Influenza B by PCR: NEGATIVE
SARS Coronavirus 2 by RT PCR: NEGATIVE

## 2020-12-26 MED ORDER — WITCH HAZEL-GLYCERIN EX PADS: 1.0000 "application " | MEDICATED_PAD | CUTANEOUS | Status: DC | PRN

## 2020-12-26 MED ORDER — OXYTOCIN-SODIUM CHLORIDE 30-0.9 UT/500ML-% IV SOLN: 2.5000 [IU]/h | INTRAVENOUS | Status: DC

## 2020-12-26 MED ORDER — OXYTOCIN 10 UNIT/ML IJ SOLN: 10.0000 [IU] | Freq: Once | INTRAMUSCULAR | Status: AC

## 2020-12-26 MED ORDER — ZOLPIDEM TARTRATE 5 MG PO TABS: 5.0000 mg | ORAL_TABLET | Freq: Every evening | ORAL | Status: DC | PRN

## 2020-12-26 MED ORDER — IBUPROFEN 600 MG PO TABS
600.0000 mg | ORAL_TABLET | Freq: Four times a day (QID) | ORAL | Status: DC
  Administered 2020-12-26 – 2020-12-27 (×4): 600 mg via ORAL
  Filled 2020-12-26 (×5): qty 1

## 2020-12-26 MED ORDER — LACTATED RINGERS IV SOLN: INTRAVENOUS | Status: DC

## 2020-12-26 MED ORDER — OXYTOCIN BOLUS FROM INFUSION: 333.0000 mL | Freq: Once | INTRAVENOUS | Status: DC

## 2020-12-26 MED ORDER — LIDOCAINE HCL (PF) 1 % IJ SOLN: 30.0000 mL | INTRAMUSCULAR | Status: DC | PRN

## 2020-12-26 MED ORDER — PRENATAL MULTIVITAMIN CH
1.0000 | ORAL_TABLET | Freq: Every day | ORAL | Status: DC
  Administered 2020-12-26 – 2020-12-27 (×2): 1 via ORAL
  Filled 2020-12-26 (×2): qty 1

## 2020-12-26 MED ORDER — LACTATED RINGERS IV SOLN: 500.0000 mL | INTRAVENOUS | Status: DC | PRN

## 2020-12-26 MED ORDER — ONDANSETRON HCL 4 MG/2ML IJ SOLN: 4.0000 mg | INTRAMUSCULAR | Status: DC | PRN

## 2020-12-26 MED ORDER — SENNOSIDES-DOCUSATE SODIUM 8.6-50 MG PO TABS
2.0000 | ORAL_TABLET | ORAL | Status: DC
  Administered 2020-12-26 – 2020-12-27 (×2): 2 via ORAL
  Filled 2020-12-26 (×2): qty 2

## 2020-12-26 MED ORDER — SIMETHICONE 80 MG PO CHEW: 80.0000 mg | CHEWABLE_TABLET | ORAL | Status: DC | PRN

## 2020-12-26 MED ORDER — TETANUS-DIPHTH-ACELL PERTUSSIS 5-2.5-18.5 LF-MCG/0.5 IM SUSY: 0.5000 mL | PREFILLED_SYRINGE | Freq: Once | INTRAMUSCULAR | Status: DC

## 2020-12-26 MED ORDER — DIBUCAINE (PERIANAL) 1 % EX OINT: 1.0000 "application " | TOPICAL_OINTMENT | CUTANEOUS | Status: DC | PRN

## 2020-12-26 MED ORDER — OXYCODONE-ACETAMINOPHEN 5-325 MG PO TABS: 2.0000 | ORAL_TABLET | ORAL | Status: DC | PRN

## 2020-12-26 MED ORDER — ACETAMINOPHEN 325 MG PO TABS
650.0000 mg | ORAL_TABLET | ORAL | Status: DC | PRN
  Administered 2020-12-26: 650 mg via ORAL
  Filled 2020-12-26: qty 2

## 2020-12-26 MED ORDER — SOD CITRATE-CITRIC ACID 500-334 MG/5ML PO SOLN: 30.0000 mL | ORAL | Status: DC | PRN

## 2020-12-26 MED ORDER — ONDANSETRON HCL 4 MG PO TABS: 4.0000 mg | ORAL_TABLET | ORAL | Status: DC | PRN

## 2020-12-26 MED ORDER — OXYTOCIN 10 UNIT/ML IJ SOLN
INTRAMUSCULAR | Status: AC
  Administered 2020-12-26: 10 [IU] via INTRAMUSCULAR
  Filled 2020-12-26: qty 1

## 2020-12-26 MED ORDER — ONDANSETRON HCL 4 MG/2ML IJ SOLN: 4.0000 mg | Freq: Four times a day (QID) | INTRAMUSCULAR | Status: DC | PRN

## 2020-12-26 MED ORDER — ACETAMINOPHEN 325 MG PO TABS: 650.0000 mg | ORAL_TABLET | ORAL | Status: DC | PRN

## 2020-12-26 MED ORDER — DIPHENHYDRAMINE HCL 25 MG PO CAPS: 25.0000 mg | ORAL_CAPSULE | Freq: Four times a day (QID) | ORAL | Status: DC | PRN

## 2020-12-26 MED ORDER — COCONUT OIL OIL: 1.0000 "application " | TOPICAL_OIL | Status: DC | PRN

## 2020-12-26 MED ORDER — BENZOCAINE-MENTHOL 20-0.5 % EX AERO
1.0000 | INHALATION_SPRAY | CUTANEOUS | Status: DC | PRN
Start: 2020-12-26 — End: 2020-12-27

## 2020-12-26 MED ORDER — OXYCODONE-ACETAMINOPHEN 5-325 MG PO TABS: 1.0000 | ORAL_TABLET | ORAL | Status: DC | PRN

## 2020-12-26 NOTE — Lactation Note (Signed)
This note was copied from a baby's chart. Lactation Consultation Note  Patient Name: Tami Clark [id number] Date: 12/26/2020 Reason for consult: Mother's request;Difficult latch Age:24 hours  LC in to room per mother's request to assist with latch. Demonstrated alignment, support pillows and hand expression. Colostrum is easily expressed. Latched infant after a few attempts. Noted suckling and swallowing. Mother denies discomfort or pain. Educated mom about deep latch for an optimal breastfeeding session.  Plan:   1-Breastfeeding on demand, ensuring a deep, comfortable latch.  2-Keep infant awake during breastfeeding session: massaging breast, infant's hand/shoulder/feet 3-Monitor voids and stools as signs good intake.   Contact LC as needed for feeds/support/concerns/questions   Feeding Mother's Current Feeding Choice: Breast Milk  LATCH Score Latch: Grasps breast easily, tongue down, lips flanged, rhythmical sucking.  Audible Swallowing: Spontaneous and intermittent  Type of Nipple: Everted at rest and after stimulation  Comfort (Breast/Nipple): Soft / non-tender  Hold (Positioning): Assistance needed to correctly position infant at breast and maintain latch.  LATCH Score: 9  Interventions Interventions: Assisted with latch;Skin to skin;Hand express;Adjust position;Support pillows;Position options;Expressed milk  Consult Status Consult Status: Follow-up Date: 12/27/20    Mohini Heathcock A Higuera Ancidey 12/26/2020, 10:34 PM

## 2020-12-26 NOTE — MAU Note (Signed)
Attempt to call MD Charge Nurse to give report. Shanda Bumps, RN, stated Hermel[facility] Medicus would be taking the patient and to give her a call in 5 mins.

## 2020-12-26 NOTE — Discharge Summary (Signed)
Postpartum Discharge Summary  Date of Service updated     Patient Name: Tami Clark DOB: November 09, 1995 MRN: [id number]  Date of admission: 12/26/2020 Delivery date:12/26/2020  Delivering provider: Hansel Feinstein CNM  Date of discharge: 12/27/2020  Admitting diagnosis:   Active Labor at [redacted]w[redacted]d  Intrauterine pregnancy: [redacted]w[redacted]d Secondary diagnosis:  Post Spontaneous Vaginal Delivery (Controlled Precipitous)     Additional problems: n/a    Discharge diagnosis: Term Pregnancy Delivered                                              Post partum procedures:n/a Augmentation: N/A Complications: None  Hospital course: Onset of Labor With Vaginal Delivery      25 y.o. yo G2P1001 at [redacted]w[redacted]d was admitted in Active Labor on 12/26/2020. Patient had an uncomplicated labor course as follows:  Membrane Rupture Time/Date: 6:09 AM ,12/26/2020   Arrived in MAU with head crowing.  Had a controlled precipitous SVD with no complications Delivery Method:Vaginal, Spontaneous  Episiotomy: None  Lacerations:  None  Patient had an uncomplicated postpartum course.  She is ambulating, tolerating a regular diet, passing flatus, and urinating well. Patient is discharged home in stable condition on 12/27/20.  Newborn Data: Birth date:12/26/2020  Birth time:6:11 AM  Gender:Female  Living status:Living  Apgars:8 ,9  Weight:3450 g   Magnesium Sulfate received: No BMZ received: No Rhophylac:N/A MMR:N/A T-DaP:Given prenatally Flu: N/A Transfusion:No  Physical exam  Vitals: Vitals:   12/26/20 2157 12/27/20 0541  BP: 109/78 114/69  Pulse: 92 80  Resp: 16 17  Temp: 98.5 F (36.9 C) 98.5 F (36.9 C)  SpO2: 98% 98%                  General: alert, cooperative, and no distress Lochia: appropriate Uterine Fundus: firm Incision: N/A DVT Evaluation: No evidence of DVT seen on physical exam. Labs: Lab Results     Edinburgh Score:     After visit meds:  Allergies as of 12/26/2020    NKDA     Discharge home in stable condition Infant Feeding: Breast Infant Disposition:home with mother Discharge instruction: per After Visit Summary and Postpartum booklet. Activity: Advance as tolerated. Pelvic rest for 6 weeks.  Diet: routine diet Anticipated Birth Control: IUD Postpartum Appointment:4 weeks Additional Postpartum F/U: n/a Future Appointments:  Follow up Visit:      Renee Harder, MSN, CNM 12/27/20 11:51 AM

## 2020-12-26 NOTE — MAU Note (Signed)
First attempt to call Hermel[facility] Medicus on MB.

## 2020-12-26 NOTE — MAU Provider Note (Signed)
Chief Complaint:  Contractions and Rupture of Membranes   Event Date/Time   First Provider Initiated Contact with Patient 12/26/20 0605   HPI: Tami Clark is a 25 y.o. G2P1001 at 78w3dwho presents to maternity admissions reporting feeling pressure of baby's head on her perineum (see Delivery note).  Escorted to room via [facility] and placed on bed for delivery. . ROS:  Level 5 Caveat applies due to acuity of condition  Abdominal Pain This is a new problem. The current episode started today. The problem occurs intermittently. The quality of the pain is cramping. Pertinent negatives include no fever or headaches. Nothing aggravates the pain. The pain is relieved by Nothing. She has tried nothing for the symptoms.   Past Medical History: Past Medical History:  Diagnosis Date   Anemia    COVID-19 08/2019   Group beta Strep positive 02/16/2019   Low-lying placenta 08/07/2020   Resolved on 7/21 4/19: posterior low lying placenta >>6/9 1.7 cm from os   Obesity (BMI 30-39.9) 06/17/2020    Past obstetric history: OB History  Gravida Para Term Preterm AB Living  2 1 1     1   SAB IAB Ectopic Multiple Live Births        0 1    # Outcome Date GA Lbr Len/2nd Weight Sex Delivery Anes PTL Lv  2 Current           1 Term 02/16/19 [redacted]w[redacted]d 21:10 / 00:21 3731 g M Vag-Spont EPI  LIV     Birth Comments: wnl    Past Surgical History: Past Surgical History:  Procedure Laterality Date   NO PAST SURGERIES      Family History: Family History  Problem Relation Age of Onset   Arthritis Father    Hypertension Maternal Aunt    Arthritis Maternal Uncle    Arthritis Paternal Aunt    Asthma Paternal Aunt    Hypertension Paternal Aunt    Hypertension Maternal Grandmother    Asthma Maternal Grandfather    Diabetes Maternal Grandfather    Stroke Maternal Grandfather    Arthritis Paternal Grandmother    Hypertension Paternal Grandmother     Social History: Social History   Tobacco Use   Smoking  status: Never   Smokeless tobacco: Never  Vaping Use   Vaping Use: Never used  Substance Use Topics   Alcohol use: No   Drug use: No    Allergies: No Known Allergies  Meds:  Medications Prior to Admission  Medication Sig Dispense Refill Last Dose   Prenatal Vit-Fe Fumarate-FA (PRENATAL MULTIVITAMIN) TABS tablet Take 1 tablet by mouth daily at 12 noon.   12/25/2020    I have reviewed patient's Past Medical Hx, Surgical Hx, Family Hx, Social Hx, medications and allergies.   ROS:  Review of Systems  Constitutional:  Negative for fever.  Gastrointestinal:  Positive for abdominal pain.  Neurological:  Negative for headaches.  Other systems negative  Physical Exam  Patient Vitals for the past 24 hrs:  BP Temp Pulse Resp SpO2  12/26/20 0715 120/80 -- 89 -- --  12/26/20 0710 -- -- -- -- 100 %  12/26/20 0700 112/83 98 F (36.7 C) 92 18 100 %  12/26/20 0646 114/70 98.1 F (36.7 C) 94 17 100 %  12/26/20 0629 115/74 -- 84 -- --   Constitutional: Well-developed, well-nourished female in active labor.   Cardiovascular: normal rate and rhythm Respiratory: normal effort GI: Abd soft, non-tender, gravid appropriate for gestational age.  No rebound or guarding. MS: Extremities nontender, no edema, normal ROM Neurologic: Alert and oriented x 4.  GU: Neg CVAT.  PELVIC EXAM: Fetal head is crowning at perineum.    FHT:  unable to assess   Labs: No results found for this or any previous visit (from the past 24 hour(s)). O/Positive/-- (02/28 1408)  Imaging:  No results found.  MAU Course/MDM: I have ordered routine orders   Treatments in MAU included SVD.    Assessment: SIngle IUP at [redacted]w[redacted]d Active Labor, Second Stage  Plan: Admit to MAU and then transfer to Postpartum Routine Care   Wynelle Bourgeois CNM, MSN Certified Nurse-Midwife 12/26/2020 7:24 AM

## 2020-12-26 NOTE — Lactation Note (Signed)
This note was copied from a baby's chart. Lactation Consultation Note  Patient Name: Tami Clark [id number] Date: 12/26/2020 Reason for consult: Initial assessment;Mother's request;Difficult latch;Term Age:24 hours LC assisted getting a deeper latch with signs of audible swallows heard with compression.   Mom shallow latch before LC arrival with a compression stripe but no other trauma. Mom aware use EBM for nipple care.   Plan 1 To feed based in cues 8-12x 24 hr period.  2. Mom to latch at breast and listen for audible swallows.  3. If unable to latch, Mom to offer EBM via spoon then latch.  4. I and O sheet reviewed.  5. LC brochure of inpatient and outpatient services reviewed.  All questions answered at the end of the visit.   Maternal Data Has patient been taught Hand Expression?: Yes Does the patient have breastfeeding experience prior to this delivery?: Yes How long did the patient breastfeed?: 1 month during covid, infant not gaining birthweight switched to formula  Feeding Mother's Current Feeding Choice: Breast Milk  LATCH Score Latch: Repeated attempts needed to sustain latch, nipple held in mouth throughout feeding, stimulation needed to elicit sucking reflex.  Audible Swallowing: Spontaneous and intermittent  Type of Nipple: Everted at rest and after stimulation  Comfort (Breast/Nipple): Soft / non-tender  Hold (Positioning): Assistance needed to correctly position infant at breast and maintain latch.  LATCH Score: 8   Lactation Tools Discussed/Used    Interventions Interventions: Breast feeding basics reviewed;Breast compression;Assisted with latch;Adjust position;Skin to skin;Support pillows;Breast massage;Position options;Hand express;Expressed milk;Education  Discharge    Consult Status Consult Status: Follow-up Date: 12/27/20 Follow-up type: In-patient    Nasiir Monts  Nicholson-Springer 12/26/2020, 2:24 PM

## 2020-12-26 NOTE — MAU Note (Signed)
Patient rushed into room on wheelchair after registration called stating "the baby was coming out." Hilda Lias, CNM was at bedside. Patient reported her water just broke and felt a head. Patient eased into bed as she was crowning. Female delivered at [phone].

## 2020-12-26 NOTE — MAU Note (Signed)
Baby hugs tag [phone].

## 2020-12-27 NOTE — Progress Notes (Addendum)
Patient ID: Tami Clark, female   DOB: April 03, 1996, 25 y.o.   MRN: [id number]  POSTPARTUM PROGRESS NOTE  Post Partum Day 1  Subjective:  Tami Clark is a 25 y.o. G2P1001 s/p SVD at [redacted]w[redacted]d.  No acute events overnight.  Pt denies problems with ambulating, voiding or po intake.  She denies nausea or vomiting.  Pain is well controlled.  She has had flatus. She has not had bowel movement.  Lochia Small.   Objective: Blood pressure 114/69, pulse 80, temperature 98.5 F (36.9 C), temperature source Oral, resp. rate 17, last menstrual period 03/25/2020, SpO2 98 %, currently breastfeeding.  Physical Exam:  General: alert, cooperative and no distress Chest: no respiratory distress Heart: regular rate, distal pulses intact Abdomen: soft, nontender,  Uterine Fundus: firm, appropriately tender DVT Evaluation: No calf swelling or tenderness Extremities: No edema Skin: warm, dry  Recent Labs    12/26/20 0808  HGB 9.9*  HCT 29.7*    Assessment/Plan: Tami Clark is a 25 y.o. G2P1001 s/p SVD at [redacted]w[redacted]d   PPD#1 - Doing well  Repeat CBC today, PO iron 325mg  if hbg < 10 Contraception: outpt IUD, condoms Feeding: breast Dispo: Plan for discharge tomorrow   LOS: 1 day   Rising, 12/27/2020, 7:59 AM     I have seen and examined this patient and agree with above documentation in the PA student's note.   Patient desires to be discharged home today.   02/26/2021, MSN, CNM 12/27/20 10:31 AM

## 2021-01-01 NOTE — H&P (Signed)
Tami Clark is a 25 y.o. female presenting for precipitous labor Had been in labor 4-5 hours but waited for FOB to get off work Arrived in MAU with head crowning in wheelchair and delivered precipitously . OB History     Gravida  2   Para  1   Term  1   Preterm      AB      Living  1      SAB      IAB      Ectopic      Multiple  0   Live Births  1          Past Medical History:  Diagnosis Date   Anemia    COVID-19 08/2019   Group beta Strep positive 02/16/2019   Low-lying placenta 08/07/2020   Resolved on 7/21 4/19: posterior low lying placenta >>6/9 1.7 cm from os   Obesity (BMI 30-39.9) 06/17/2020   Past Surgical History:  Procedure Laterality Date   NO PAST SURGERIES     Family History: family history includes Arthritis in her father, maternal uncle, paternal aunt, and paternal grandmother; Asthma in her maternal grandfather and paternal aunt; Diabetes in her maternal grandfather; Hypertension in her maternal aunt, maternal grandmother, paternal aunt, and paternal grandmother; Stroke in her maternal grandfather. Social History:  reports that she has never smoked. She has never used smokeless tobacco. She reports that she does not drink alcohol and does not use drugs.     Maternal Diabetes: No Genetic Screening: Normal Maternal Ultrasounds/Referrals: Normal Fetal Ultrasounds or other Referrals:  None Maternal Substance Abuse:  No Significant Maternal Medications:  None Significant Maternal Lab Results:  Group B Strep negative Other Comments:  None  Review of Systems  Unable to perform ROS: Acuity of condition  Constitutional:  Negative for chills and fever.  Respiratory:  Negative for shortness of breath.   Gastrointestinal:  Positive for abdominal pain.  Genitourinary:  Positive for pelvic pain and vaginal discharge.  Maternal Medical History:  Reason for admission: Contractions.   Contractions: Onset was 3-5 hours ago.   Frequency: regular.    Perceived severity is strong.   Fetal activity: Perceived fetal activity is normal.   Prenatal complications: No bleeding or PIH.   Prenatal Complications - Diabetes: none.    Blood pressure 114/69, pulse 80, temperature 98.5 F (36.9 C), temperature source Oral, resp. rate 17, last menstrual period 03/25/2020, SpO2 98 %, unknown if currently breastfeeding. Maternal Exam:  Uterine Assessment: Contraction strength is firm.  Contraction frequency is regular.  Abdomen: Patient reports no abdominal tenderness. Fetal presentation: vertex Introitus: Normal vulva. Normal vagina.  Ferning test: not done.  Nitrazine test: not done. Amniotic fluid character: not assessed. Pelvis: adequate for delivery.   Cervix: Cervix evaluated by digital exam.     Fetal Exam Fetal Monitor Review: Baseline rate: unable to auscultate due to delivery imminent.   Physical Exam Constitutional:      Appearance: She is not ill-appearing or toxic-appearing.  HENT:     Head: Normocephalic.  Cardiovascular:     Rate and Rhythm: Normal rate.  Pulmonary:     Effort: Pulmonary effort is normal.  Genitourinary:    General: Normal vulva.  Musculoskeletal:        General: Normal range of motion.     Cervical back: Normal range of motion.  Skin:    General: Skin is warm and dry.  Neurological:     General: No focal deficit present.  Mental Status: She is alert.    Cervix C/C/Crowning  Prenatal labs: ABO, Rh: --/--/O POS (09/08 0857) Antibody: NEG (09/08 0857) Rubella: 2.22 (02/28 1408) RPR: NON REACTIVE (09/08 0808)  HBsAg: Negative (02/28 1408)  HIV: Non Reactive (06/28 0856)  GBS: Negative/-- (08/25 1013)   Assessment/Plan: SIngle IUP at [redacted]w[redacted]d Active Labor, Second Stage  Admit to Mother Baby Unit after delivery Routine orders See Delivery Note     Wynelle Bourgeois 01/01/2021, 12:02 AM

## 2021-01-08 ENCOUNTER — Telehealth (HOSPITAL_COMMUNITY): Payer: Self-pay

## 2021-01-08 NOTE — Telephone Encounter (Signed)
No answer. Left message to return nurse call.  Marcelino Duster [facility] 01/08/2021,1924

## 2021-01-27 ENCOUNTER — Encounter: Payer: Self-pay | Admitting: *Deleted

## 2021-01-30 ENCOUNTER — Ambulatory Visit (INDEPENDENT_AMBULATORY_CARE_PROVIDER_SITE_OTHER): Payer: BC Managed Care – PPO | Admitting: Obstetrics & Gynecology

## 2021-01-30 ENCOUNTER — Encounter: Payer: Self-pay | Admitting: Obstetrics & Gynecology

## 2021-01-30 ENCOUNTER — Other Ambulatory Visit: Payer: Self-pay

## 2021-01-30 ENCOUNTER — Other Ambulatory Visit (HOSPITAL_COMMUNITY)
Admission: RE | Admit: 2021-01-30 | Discharge: 2021-01-30 | Disposition: A | Discharge status: Home / Self Care | Payer: BC Managed Care – PPO | Source: Ambulatory Visit | Attending: Obstetrics & Gynecology | Admitting: Obstetrics & Gynecology

## 2021-01-30 DIAGNOSIS — Z124 Encounter for screening for malignant neoplasm of cervix: Secondary | ICD-10-CM | POA: Insufficient documentation

## 2021-01-30 DIAGNOSIS — Z30430 Encounter for insertion of intrauterine contraceptive device: Secondary | ICD-10-CM

## 2021-01-30 LAB — POCT URINE PREGNANCY: Preg Test, Ur: NEGATIVE

## 2021-01-30 MED ORDER — LEVONORGESTREL 20 MCG/DAY IU IUD
1.0000 | INTRAUTERINE_SYSTEM | Freq: Once | INTRAUTERINE | Status: AC
  Administered 2021-01-30: 1 via INTRAUTERINE

## 2021-01-30 NOTE — Progress Notes (Addendum)
Post Partum Visit Note  Tami Clark is a 25 y.o. G66P2002 female who presents for a postpartum visit. She is 5 weeks postpartum following a normal spontaneous vaginal delivery.  I have fully reviewed the prenatal and intrapartum course. The delivery was at 39 gestational weeks.  Anesthesia: none. Postpartum course has been unremarkable. Baby is doing well. Baby is feeding by bottle - Gerber Gentle  . Bleeding no bleeding. Bowel function is normal. Bladder function is normal. Patient is not sexually active. Contraception method is none. Postpartum depression screening: negative. EPDS =0    The pregnancy intention screening data noted above was reviewed. Potential methods of contraception were discussed. The patient elected to proceed with IUD.   Edinburgh Postnatal Depression Scale - 01/30/21 1504       Edinburgh Postnatal Depression Scale:  In the Past 7 Days   I have been able to laugh and see the funny side of things. 0    I have looked forward with enjoyment to things. 0    I have blamed myself unnecessarily when things went wrong. 0    I have been anxious or worried for no good reason. 0    I have felt scared or panicky for no good reason. 0    Things have been getting on top of me. 0    I have been so unhappy that I have had difficulty sleeping. 0    I have felt sad or miserable. 0    I have been so unhappy that I have been crying. 0    The thought of harming myself has occurred to me. 0    Edinburgh Postnatal Depression Scale Total 0             Health Maintenance Due  Topic Date Due   COVID-19 Vaccine (1) Never done   INFLUENZA VACCINE  11/18/2020    The following portions of the patient's history were reviewed and updated as appropriate: allergies, current medications, past family history, past medical history, past social history, past surgical history, and problem list.  Review of Systems Pertinent items noted in HPI and remainder of comprehensive ROS otherwise  negative.  Objective:  BP 113/75   Pulse 93   Wt 177 lb (80.3 kg)   Breastfeeding No   BMI 32.37 kg/m    General:  alert, cooperative, and no distress   Breasts:  not indicated  Lungs: clear to auscultation bilaterally  Heart:  regular rate and rhythm  Abdomen: soft, non-tender; bowel sounds normal; no masses,  no organomegaly   GU exam:  not indicated         IUD Insertion Procedure Note Patient identified, informed consent performed, consent signed.   Discussed risks of irregular bleeding, cramping, infection, malpositioning or misplacement of the IUD outside the uterus which may require further procedure such as laparoscopy. Also discussed >99% contraception efficacy, increased risk of ectopic pregnancy with failure of method.   Emphasized that this did not protect against STIs, condoms recommended during all sexual encounters. Time out was performed.  Urine pregnancy test negative.  Speculum placed in the vagina.  Cervix visualized.  Cleaned with Betadine x 2.  Grasped anteriorly with a single tooth tenaculum.  Uterus sounded to 8 cm.  Mirena IUD placed per manufacturer's recommendations.  Strings trimmed to 3 cm. Tenaculum was removed, good hemostasis noted.  Patient tolerated procedure well.   Patient was given post-procedure instructions.  She was advised to have backup contraception for one  week.  Patient was also asked to check IUD strings periodically and follow up in 4 weeks for IUD check.  Assessment:  1. Encounter for IUD insertion 2. Screening for cervical cancer 3. Postpartum care following vaginal delivery  Plan:   Essential components of care per ACOG recommendations:  1.  Mood and well being: Patient with negative depression screening today. Reviewed local resources for support.  - Patient tobacco use? No.   - hx of drug use? No.    2. Infant care and feeding:  -Patient currently breastmilk feeding? No.  -Social determinants of health (SDOH) reviewed in  EPIC. No concerns,  3. Sexuality, contraception and birth spacing - Patient does not want a pregnancy in the next year.  Desired family size is 2 children.  - Reviewed forms of contraception in tiered fashion. Patient desired IUD today and Mirena placed today.  - Discussed birth spacing of 18 months  4. Sleep and fatigue -Encouraged family/partner/community support of 4 hrs of uninterrupted sleep to help with mood and fatigue  5. Physical Recovery  - Discussed patients delivery and complications. She describes her labor as good. - Patient had a Vaginal, no problems at delivery. Patient had no laceration. Perineal healing reviewed. Patient expressed understanding - Patient has urinary incontinence? No. - Patient is safe to resume physical and sexual activity  6.  Health Maintenance - HM due items addressed Yes - Last pap smear in 2020 normal at outside institution.  Pap smear done at today's visit.  -Breast Cancer screening indicated? No.    Jaynie Collins, MD Center for [facility], [facility]

## 2021-01-30 NOTE — Patient Instructions (Signed)
Intrauterine Device Insertion, Care After This sheet gives you information about how to care for yourself after your procedure. Your health care provider may also give you more specific instructions. If you have problems or questions, contact your health care provider. What can I expect after the procedure? After the procedure, it is common to have: Cramps and pain in the abdomen. Bleeding. It may be light or heavy. This may last for a few days. Lower back pain. Dizziness. Headaches. Nausea. Follow these instructions at home:  Before resuming sexual activity, check to make sure that you can feel the IUD string or strings. You should be able to feel the end of the string below the opening of your cervix. If your IUD string is in place, you may resume sexual activity. If you had a hormonal IUD inserted more than 7 days after your most recent period started, you will need to use a backup method of birth control for 7 days after IUD insertion. Ask your health care provider whether this applies to you. Continue to check that the IUD is still in place by feeling for the strings after every menstrual period, or once a month. An IUD will not protect you from sexually transmitted infections (STIs). Use methods to prevent the exchange of body fluids between partners (barrier protection) every time you have sex. Barrier protection can be used during oral, vaginal, or anal sex. Commonly used barrier methods include: Female condom. Female condom. Dental dam. Take over-the-counter and prescription medicines only as told by your health care provider. Keep all follow-up visits as told by your health care provider. This is important. Contact a health care provider if: You feel light-headed or weak. You have any of the following problems with your IUD string or strings: The string bothers or hurts you or your sexual partner. You cannot feel the string. The string has gotten longer. You can feel the IUD in  your vagina. You think you may be pregnant, or you miss your menstrual period. You think you may have a sexually transmitted infection (STI). Get help right away if: You have flu-like symptoms, such as tiredness (fatigue) and muscle aches. You have a fever and chills. You have bleeding that is heavier or lasts longer than a normal menstrual cycle. You have abnormal or bad-smelling discharge from your vagina. You develop abdominal pain that is new, is getting worse, or is not in the same area of earlier cramping and pain. You have pain during sexual activity. Summary After the procedure, it is common to have cramps and pain in the abdomen. It is also common to have light bleeding or heavier bleeding that is like your menstrual period. Continue to check that the IUD is still in place by feeling for the strings after every menstrual period, or once a month. Keep all follow-up visits as told by your health care provider. This is important. Contact your health care provider if you have problems with your IUD strings, such as the string getting longer or bothering you or your sexual partner. This information is not intended to replace advice given to you by your health care provider. Make sure you discuss any questions you have with your health care provider. Document Revised: 03/28/2019 Document Reviewed: 03/28/2019 Elsevier Patient Education  2022 [employer]  

## 2021-02-04 LAB — CYTOLOGY - PAP: Diagnosis: NEGATIVE

## 2021-02-27 ENCOUNTER — Ambulatory Visit: Payer: BC Managed Care – PPO | Admitting: Advanced Practice Midwife

## 2021-03-20 ENCOUNTER — Ambulatory Visit (INDEPENDENT_AMBULATORY_CARE_PROVIDER_SITE_OTHER): Payer: BC Managed Care – PPO | Admitting: Obstetrics and Gynecology

## 2021-03-20 ENCOUNTER — Other Ambulatory Visit: Payer: Self-pay

## 2021-03-20 VITALS — BP 123/86 | HR 77 | Wt 181.0 lb

## 2021-03-20 DIAGNOSIS — Z30431 Encounter for routine checking of intrauterine contraceptive device: Secondary | ICD-10-CM

## 2021-03-20 NOTE — Progress Notes (Signed)
  Obstetrics and Gynecology Visit Return Patient Evaluation  Appointment Date: 03/20/2021   OBGYN Clinic: [facility] for [facility]   Chief Complaint: IUD string check  History of Present Illness:  Tami Clark is a 25 y.o. s/p 10/13 mirena placement at her pp visit. Has had sex and no issues. She had a longer period (two weeks) and some spotting afterwards. Pap then negative. Pt has been formula feeding for quite some time now.   Review of Systems:  as noted in the History of Present Illness.  [name] had no medications administered during this visit. Current Outpatient Medications  Medication Sig Dispense Refill   levonorgestrel (MIRENA) 20 MCG/DAY IUD 1 each by Intrauterine route once.     No current facility-administered medications for this visit.    Allergies: has No Known Allergies.  Physical Exam:  BP 123/86   Pulse 77   Wt 181 lb (82.1 kg)   BMI 33.11 kg/m  Body mass index is 33.11 kg/m. General appearance: Well nourished, well developed female in no acute distress.  Abdomen: diffusely non tender to palpation, non distended, and no masses, hernias Neuro/Psych:  Normal mood and affect.    Pelvic exam:  EGBUS: normal Vaginal vault: normal Cervix:  IUD strings seen, 3-4cm in length. Cervix. Normal appearing Bimanual: deferred   Assessment: pt doing well  Plan:  1. IUD check up Routine care.    RTC: PRN  Cornelia Copa MD Attending Center for [facility])

## 2021-06-11 DIAGNOSIS — J069 Acute upper respiratory infection, unspecified: Secondary | ICD-10-CM | POA: Diagnosis not present

## 2021-06-11 DIAGNOSIS — Z20822 Contact with and (suspected) exposure to covid-19: Secondary | ICD-10-CM | POA: Diagnosis not present

## 2021-06-11 DIAGNOSIS — J029 Acute pharyngitis, unspecified: Secondary | ICD-10-CM | POA: Diagnosis not present

## 2021-06-27 DIAGNOSIS — Z23 Encounter for immunization: Secondary | ICD-10-CM | POA: Diagnosis not present

## 2021-06-27 DIAGNOSIS — Z00129 Encounter for routine child health examination without abnormal findings: Secondary | ICD-10-CM | POA: Diagnosis not present

## 2021-11-03 IMAGING — US US MFM OB FOLLOW-UP
1 series · 14 of 28 positions shown · non-contrast
Comparison: none

[Series 1: us mfm ob follow-up · 50 acquisitions, 14 frames shown]
[im 2/50]
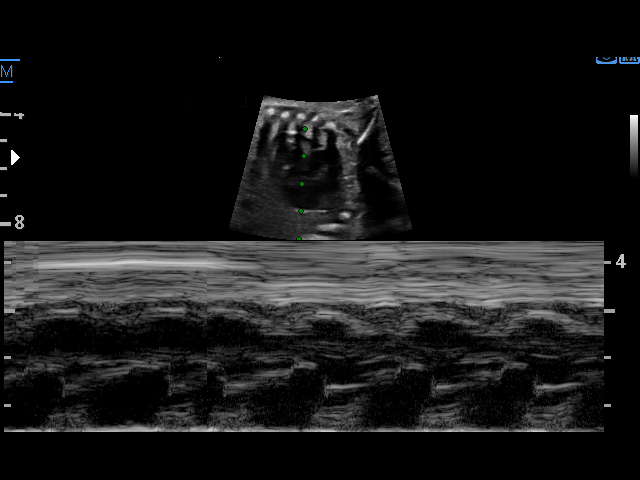
[im 6/50]
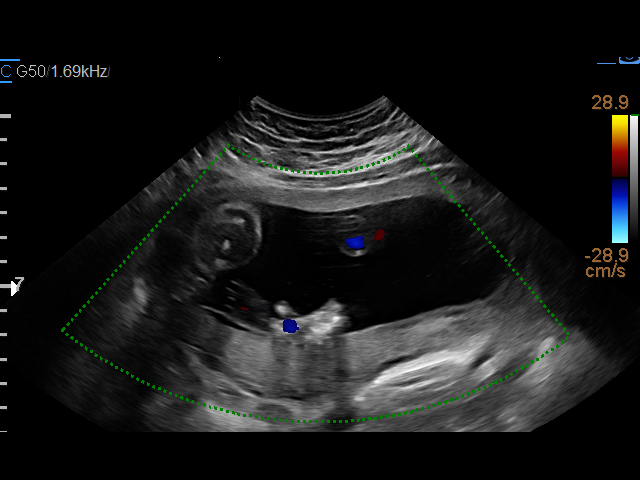
[im 10/50]
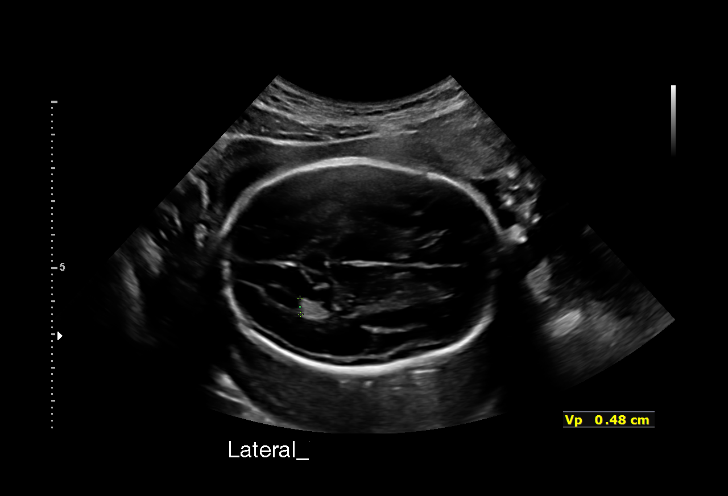
[im 13/50]
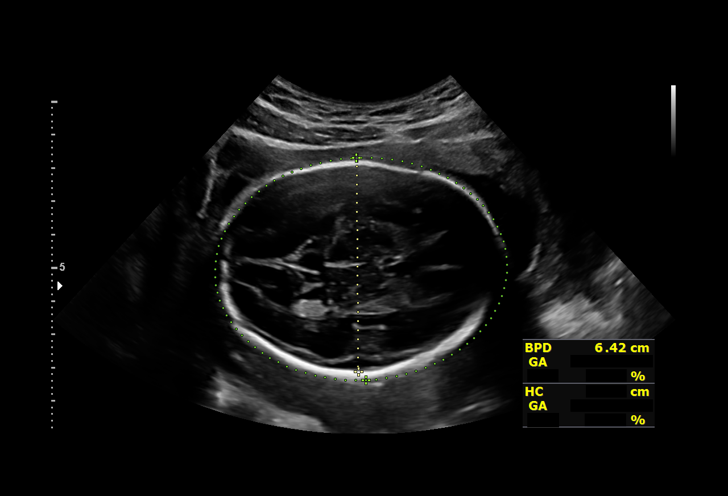
[im 17/50]
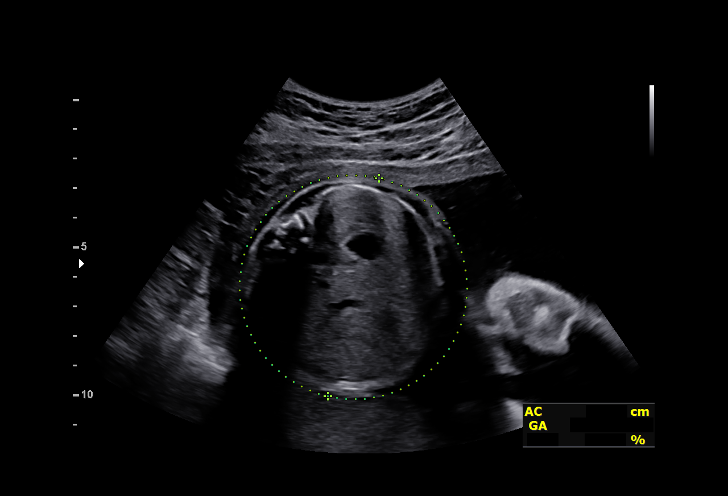
[im 20/50]
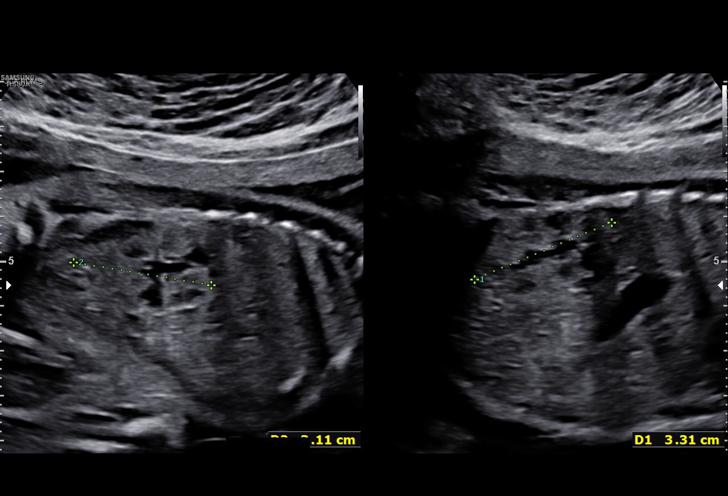
[im 24/50]
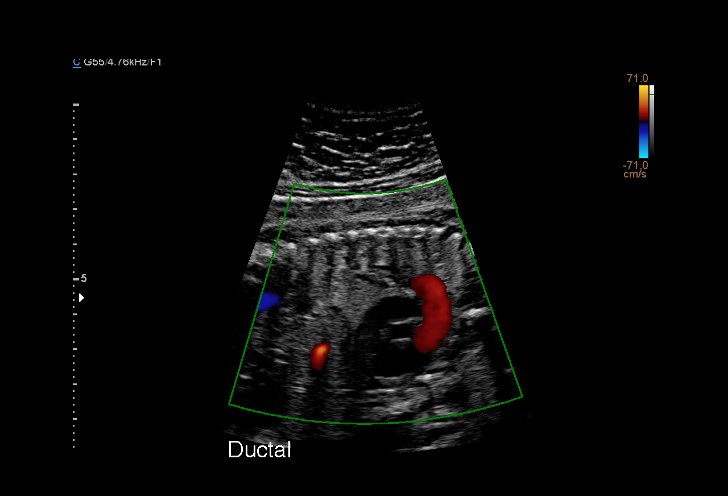
[im 28/50]
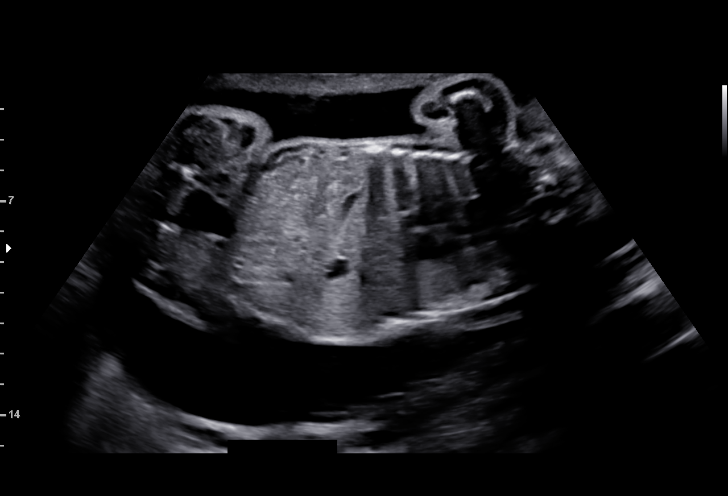
[im 31/50]
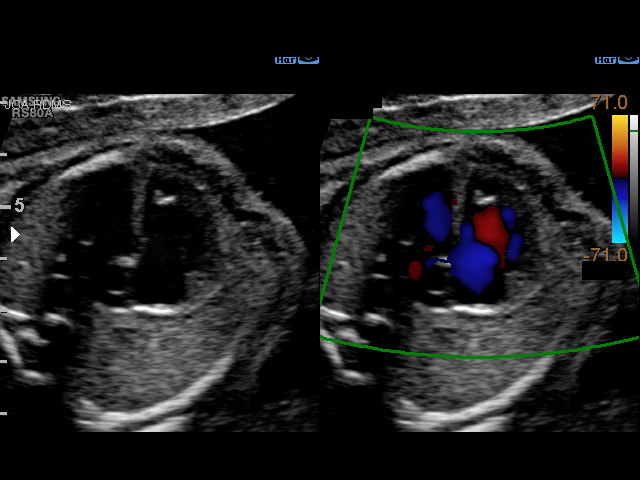
[im 35/50]
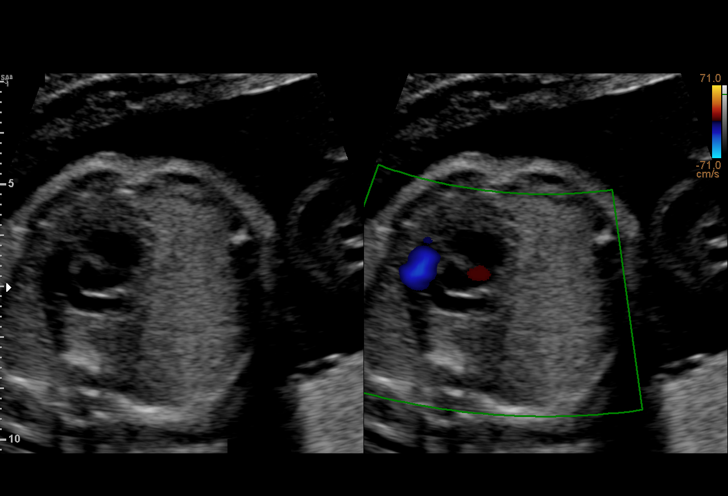
[im 39/50]
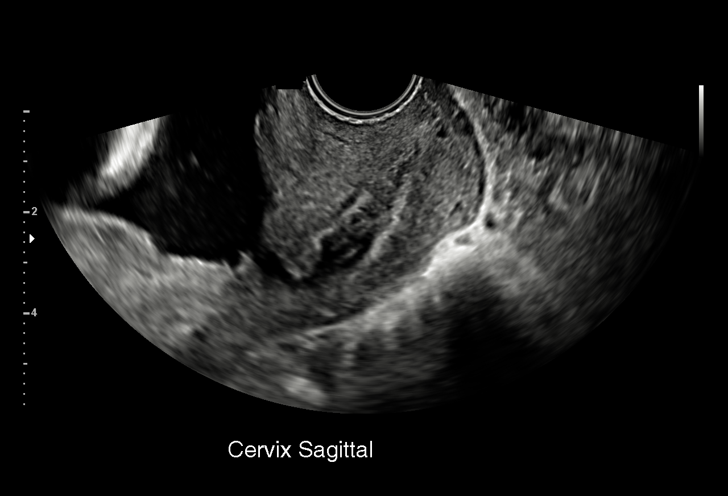
[im 42/50]
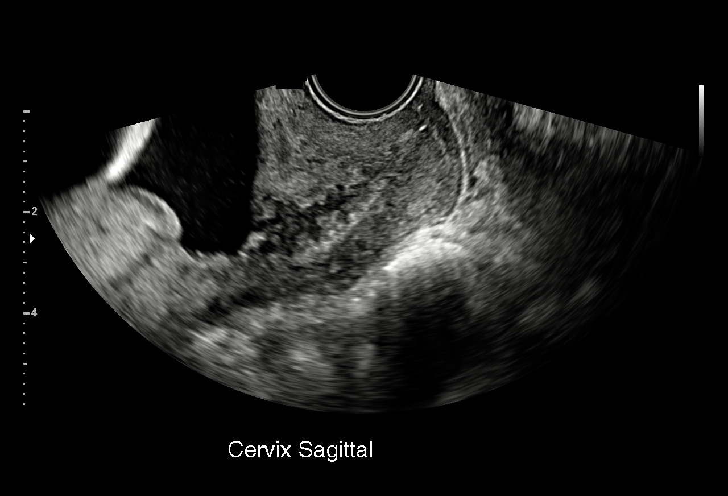
[im 46/50]
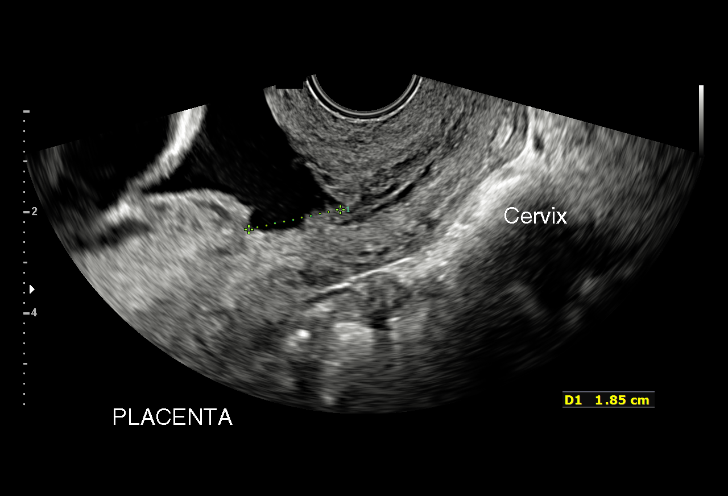
[im 50/50]
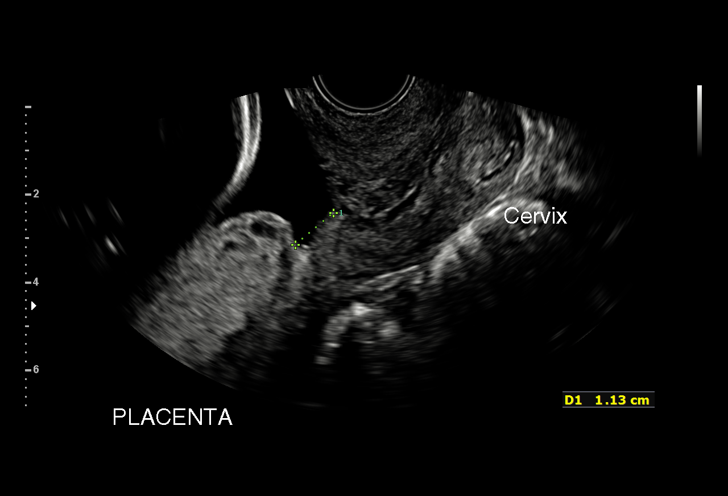

[14 of 28 positions shown; findings below may reference images not displayed]

Attending:        Adama Diop        Secondary Phy.:   [REDACTED]
 Ref. Address:     [REDACTED]

Indications

 Obesity complicating pregnancy, second
 trimester (BMI 31)
 Anemia during pregnancy in second trimester
 Encounter for other antenatal screening
 follow-up
 26 weeks gestation of pregnancy
 LR NIPS/ Negative AFP
Fetal Evaluation

 Num Of Fetuses:         1
 Fetal Heart Rate(bpm):  133
 Cardiac Activity:       Observed
 Presentation:           Cephalic
 Placenta:               Posterior, low-lying
 P. Cord Insertion:      Previously Visualized

 Amniotic Fluid
 AFI FV:      Within normal limits

                             Largest Pocket(cm)

Biometry

 BPD:      64.3  mm     G. Age:  26w 0d         25  %    CI:        69.36   %    70 - 86
                                                         FL/HC:      20.2   %    18.6 -
 HC:      246.5  mm     G. Age:  26w 5d         35  %    HC/AC:      1.03        1.04 -
 AC:       240   mm     G. Age:  28w 2d         90  %    FL/BPD:     77.6   %    71 - 87
 FL:       49.9  mm     G. Age:  26w 6d         49  %    FL/AC:      20.8   %    20 - 24
 HUM:      44.8  mm     G. Age:  26w 4d         49  %

 LV:        4.8  mm

 Est. FW:    4458  gm      2 lb 6 oz     82  %
OB History

 Blood Type:   O+
 Gravidity:    2         Term:   1        Prem:   0        SAB:   0
 TOP:          0       Ectopic:  0        Living: 1
Gestational Age

 LMP:           26w 3d        Date:  03/25/20                 EDD:   12/30/20
 U/S Today:     27w 0d                                        EDD:   12/26/20
 Best:          26w 3d     Det. By:  LMP  (03/25/20)          EDD:   12/30/20
Anatomy



 Other:  Fetus appears to be a male. Nasal bone prev visualized. Lenses prev
         visualized. Heels/feet and open hands/5th digits prev visualized.
Impression

 Patient returned for fetal growth and placental location
 assessments.  Low-lying placenta was seen on previous
 ultrasound.  She does not give history of vaginal bleeding.
 Fetal growth is appropriate for gestational age.  Amniotic fluid
 is normal and good fetal activity seen.  We performed
 transvaginal ultrasound to evaluate the placenta.  The
 placental edge is 1.7 cm from the internal os (low-lying
 placenta).
 I explained the findings with help of ultrasound images.  Low-
 lying placenta is likely to resolve with advancing gestation.
 Abstinence from sexual intercourse is not necessary.
Recommendations

 -An appointment was made for her to return in 6 weeks for
 fetal growth and placental location assessments.
                 DENULA

## 2021-12-15 IMAGING — US US MFM OB FOLLOW-UP
1 series · 14 of 20 positions shown · non-contrast
Comparison: none

[Series 1: us mfm ob follow-up · 20 acquisitions, 14 frames shown]
[im 1/20]
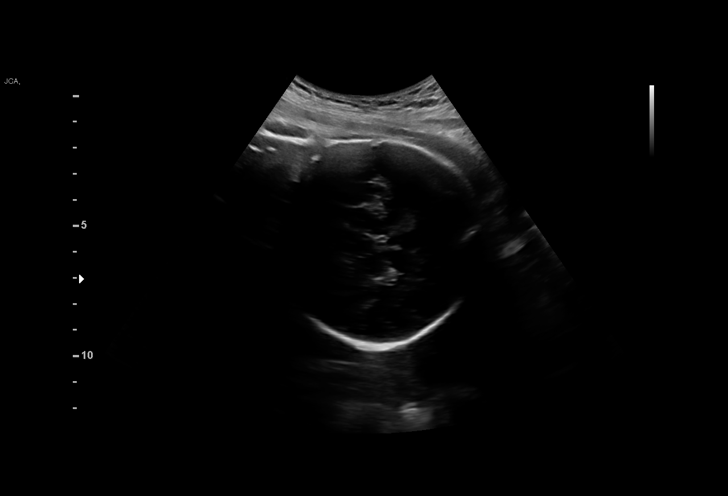
[im 3/20]
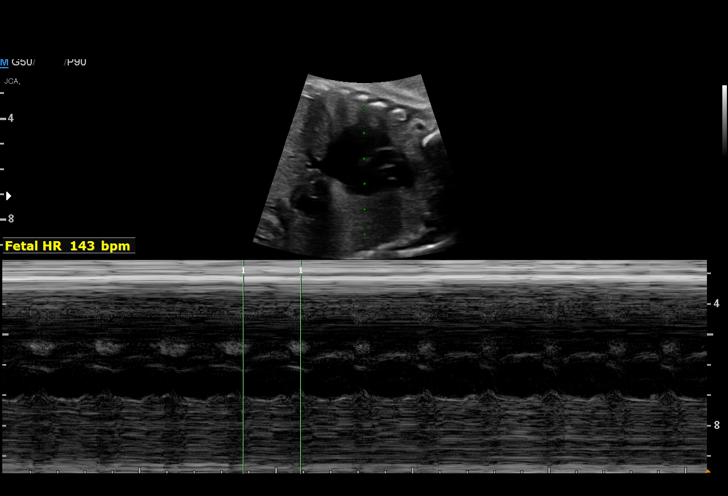
[im 4/20]
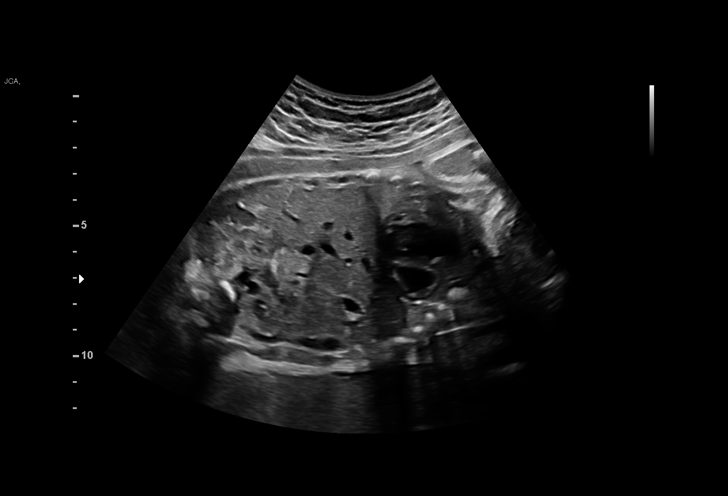
[im 6/20]
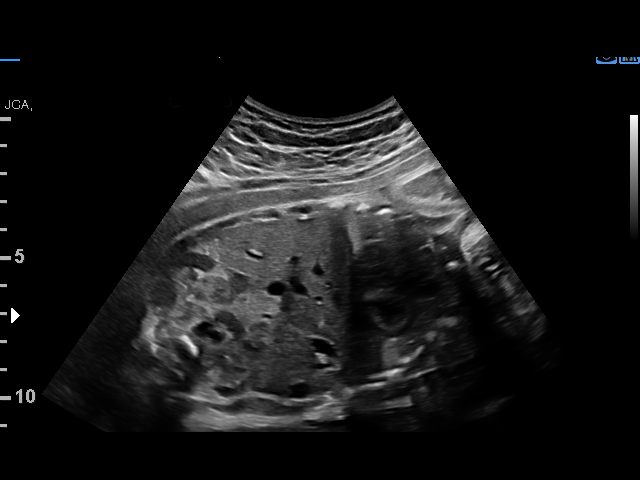
[im 7/20]
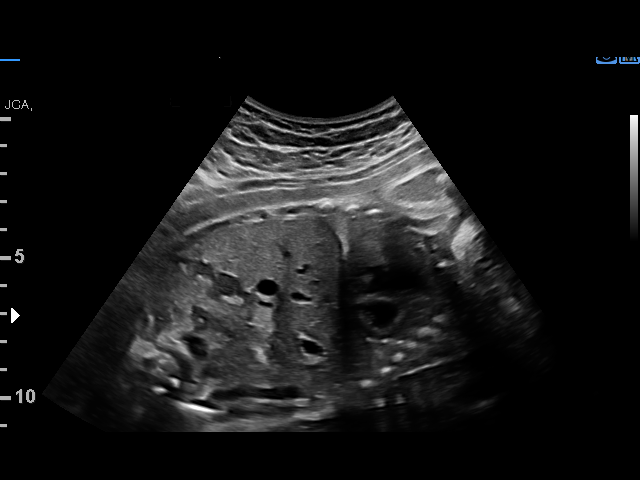
[im 8/20]
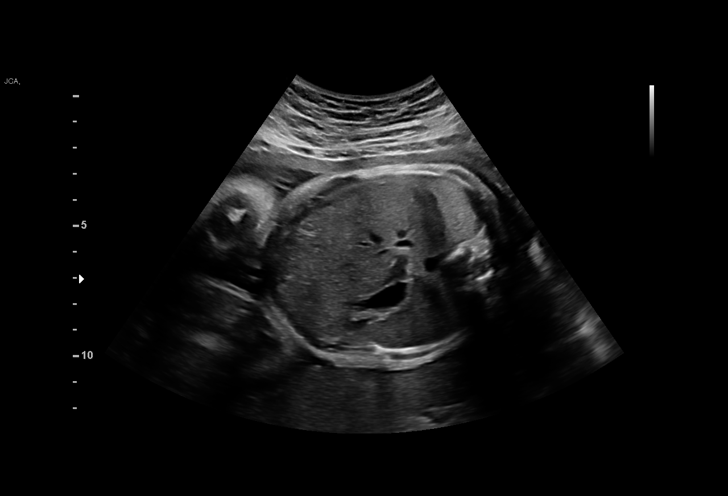
[im 10/20]
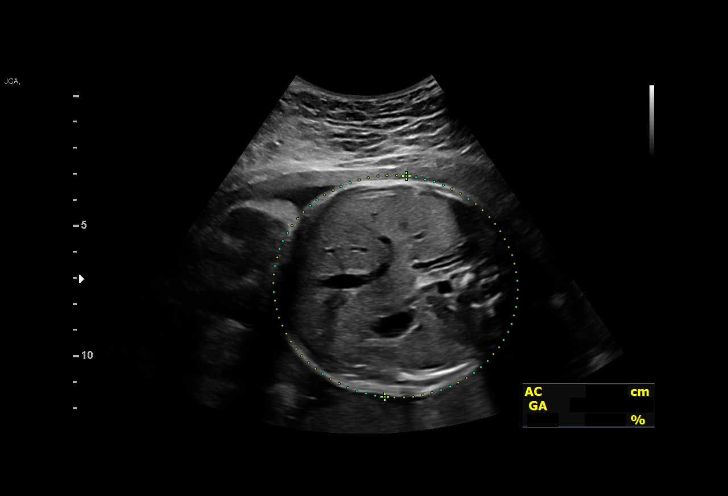
[im 11/20]
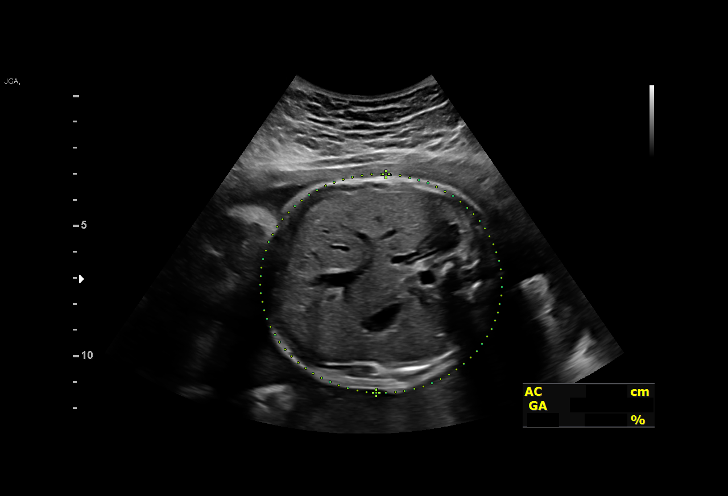
[im 13/20]
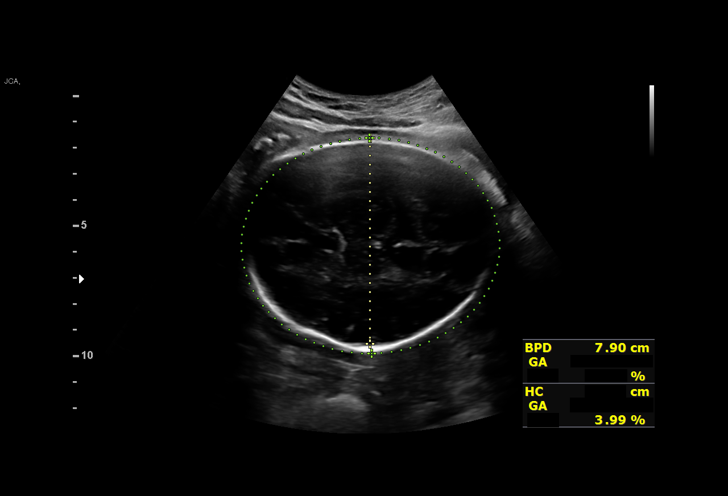
[im 14/20]
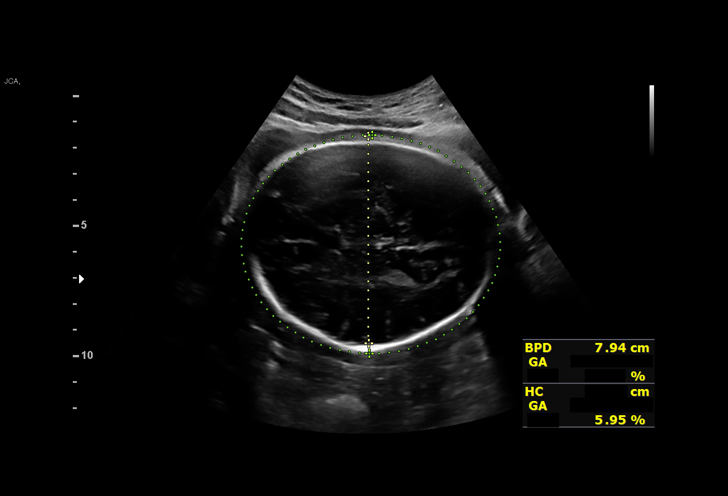
[im 16/20]
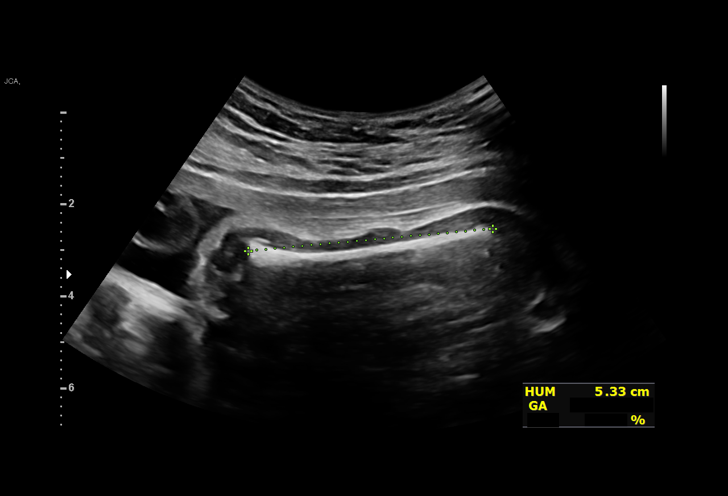
[im 17/20]
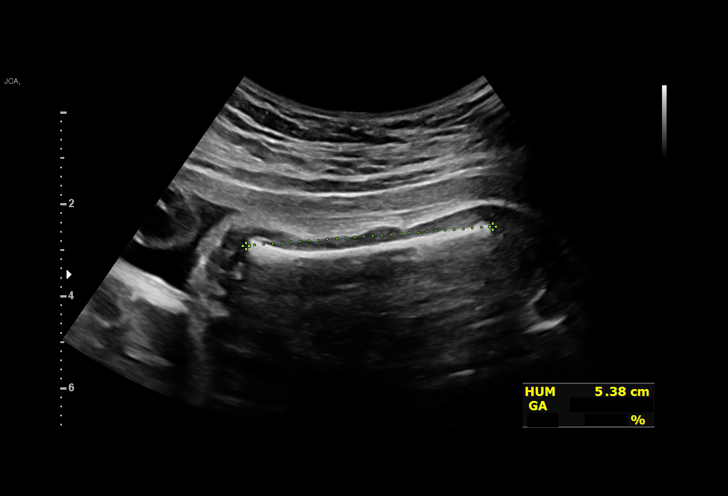
[im 18/20]
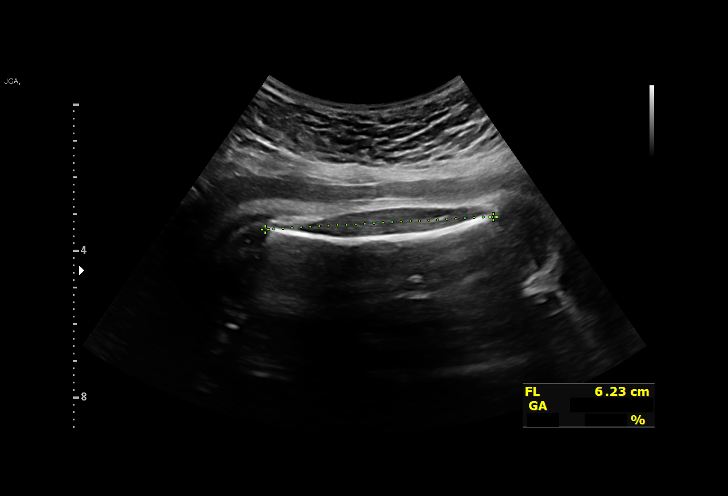
[im 20/20]
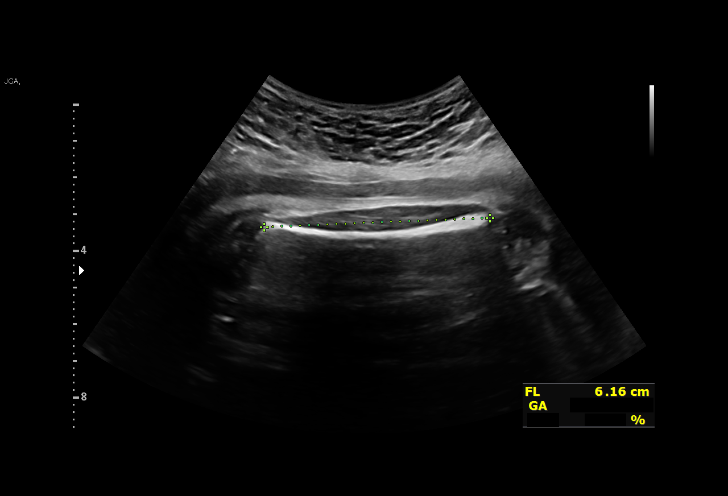

[14 of 20 positions shown; findings below may reference images not displayed]

Attending:        Luis Frank Nielsen        Secondary Phy.:   [REDACTED]
 Ref. Address:     [REDACTED]

Indications

 Obesity complicating pregnancy, second
 trimester (BMI 31)
 Anemia during pregnancy in second trimester
 Encounter for other antenatal screening
 follow-up
 32 weeks gestation of pregnancy
 LR NIPS/ Negative AFP
Fetal Evaluation

 Num Of Fetuses:         1
 Fetal Heart Rate(bpm):  143
 Cardiac Activity:       Observed
 Presentation:           Cephalic
 Placenta:               Posterior
 P. Cord Insertion:      Previously Visualized

 Amniotic Fluid
 AFI FV:      Within normal limits

 AFI Sum(cm)     %Tile       Largest Pocket(cm)
 11.3            27

 RUQ(cm)       RLQ(cm)       LUQ(cm)        LLQ(cm)
 2.9           1.7           3.7            3
Biometry

 BPD:      79.1  mm     G. Age:  31w 5d         23  %    CI:        75.44   %    70 - 86
                                                         FL/HC:      21.4   %    19.1 -
 HC:      288.8  mm     G. Age:  31w 5d          6  %    HC/AC:      1.03        0.96 -
 AC:      281.4  mm     G. Age:  32w 1d         42  %    FL/BPD:     78.0   %    71 - 87
 FL:       61.7  mm     G. Age:  32w 0d         26  %    FL/AC:      21.9   %    20 - 24
 HUM:      53.7  mm     G. Age:  31w 2d         31  %

 Est. FW:    3713  gm      4 lb 3 oz     28  %
OB History

 Blood Type:   O+
 Gravidity:    2         Term:   1        Prem:   0        SAB:   0
 TOP:          0       Ectopic:  0        Living: 1
Gestational Age

 LMP:           32w 3d        Date:  03/25/20                 EDD:   12/30/20
 U/S Today:     31w 6d                                        EDD:   01/03/21
 Best:          32w 3d     Det. By:  LMP  (03/25/20)          EDD:   12/30/20
Anatomy

 Cranium:               Appears normal         LVOT:                   Previously seen
 Cavum:                 Previously seen        Aortic Arch:            Previously seen
 Ventricles:            Previously seen        Ductal Arch:            Previously seen
 Choroid Plexus:        Previously seen        Diaphragm:              Appears normal
 Cerebellum:            Previously seen        Stomach:                Appears normal, left
                                                                       sided
 Posterior Fossa:       Previously seen        Abdomen:                Previously seen
 Nuchal Fold:           Not applicable (>20    Abdominal Wall:         Previously seen
                        wks GA)
 Face:                  Orbits and profile     Cord Vessels:           Previously seen
                        previously seen
 Lips:                  Previously seen        Kidneys:                Previously seen
 Palate:                Previously seen        Bladder:                Previously seen
 Thoracic:              Appears normal         Spine:                  Previously seen
 Heart:                 Appears normal         Upper Extremities:      Previously seen
                        (4CH, axis, and
                        situs)
 RVOT:                  Previously seen        Lower Extremities:      Previously seen

 Other:  Fetus appears to be a male. Nasal bone prev visualized. Lenses prev
         visualized. Heels/feet and open hands/5th digits prev visualized.
Impression

 Patient returned for fetal growth assessment and evaluation
 of placental position.

 Fetal growth is appropriate for gestational age .Amniotic fluid
 is normal and good fetal activity is seen .  Placenta is
 posterior and there is no evidence of placenta previa or low-
 lying placenta.  Cephalic presentation.
 I reassured the patient of the findings.
Recommendations

 Follow-up scans as clinically indicated.
                 JURMINA
